# Patient Record
Sex: Female | Born: 1955 | Race: White | Hispanic: No | Smoking: Former smoker
Health system: Southern US, Community
[De-identification: ages and names within clinical notes are randomized; demographics above are authoritative.]

## PROBLEM LIST (undated history)

## (undated) DIAGNOSIS — K219 Gastro-esophageal reflux disease without esophagitis: Secondary | ICD-10-CM

## (undated) DIAGNOSIS — E039 Hypothyroidism, unspecified: Secondary | ICD-10-CM

## (undated) DIAGNOSIS — M94262 Chondromalacia, left knee: Secondary | ICD-10-CM

## (undated) DIAGNOSIS — K8020 Calculus of gallbladder without cholecystitis without obstruction: Secondary | ICD-10-CM

## (undated) DIAGNOSIS — M1990 Unspecified osteoarthritis, unspecified site: Secondary | ICD-10-CM

## (undated) HISTORY — DX: Calculus of gallbladder without cholecystitis without obstruction: K80.20

## (undated) HISTORY — PX: CHOLECYSTECTOMY: SHX55

## (undated) HISTORY — PX: UMBILICAL HERNIA REPAIR: SUR1181

## (undated) HISTORY — PX: OTHER SURGICAL HISTORY: SHX169

## (undated) HISTORY — PX: HERNIA REPAIR: SHX51

## (undated) HISTORY — DX: Hypothyroidism, unspecified: E03.9

## (undated) HISTORY — PX: ORTHOPEDIC SURGERY: SHX850

---

## 2017-07-20 ENCOUNTER — Emergency Department (HOSPITAL_COMMUNITY): Payer: 59

## 2017-07-20 ENCOUNTER — Other Ambulatory Visit: Payer: Self-pay

## 2017-07-20 ENCOUNTER — Encounter (HOSPITAL_COMMUNITY): Payer: Self-pay | Admitting: Emergency Medicine

## 2017-07-20 ENCOUNTER — Emergency Department (HOSPITAL_COMMUNITY)
Admission: EM | Admit: 2017-07-20 | Discharge: 2017-07-20 | Disposition: A | Discharge status: Home / Self Care | Payer: 59 | Attending: Emergency Medicine | Admitting: Emergency Medicine

## 2017-07-20 DIAGNOSIS — R1032 Left lower quadrant pain: Secondary | ICD-10-CM

## 2017-07-20 DIAGNOSIS — Z79899 Other long term (current) drug therapy: Secondary | ICD-10-CM | POA: Diagnosis not present

## 2017-07-20 DIAGNOSIS — K529 Noninfective gastroenteritis and colitis, unspecified: Secondary | ICD-10-CM | POA: Diagnosis not present

## 2017-07-20 LAB — CBC
HCT: 42.3 % (ref 36.0–46.0)
Hemoglobin: 14.2 g/dL (ref 12.0–15.0)
MCH: 29.2 pg (ref 26.0–34.0)
MCHC: 33.6 g/dL (ref 30.0–36.0)
MCV: 87 fL (ref 78.0–100.0)
Platelets: 185 10*3/uL (ref 150–400)
RBC: 4.86 MIL/uL (ref 3.87–5.11)
RDW: 13.7 % (ref 11.5–15.5)
WBC: 6.4 10*3/uL (ref 4.0–10.5)

## 2017-07-20 LAB — COMPREHENSIVE METABOLIC PANEL
ALBUMIN: 4.6 g/dL (ref 3.5–5.0)
ALK PHOS: 72 U/L (ref 38–126)
ALT: 19 U/L (ref 14–54)
ANION GAP: 9 (ref 5–15)
AST: 24 U/L (ref 15–41)
BILIRUBIN TOTAL: 0.5 mg/dL (ref 0.3–1.2)
BUN: 14 mg/dL (ref 6–20)
CALCIUM: 9 mg/dL (ref 8.9–10.3)
CO2: 25 mmol/L (ref 22–32)
CREATININE: 0.98 mg/dL (ref 0.44–1.00)
Chloride: 104 mmol/L (ref 101–111)
GFR calc Af Amer: >60 mL/min (ref >60)
GFR calc non Af Amer: >60 mL/min (ref >60)
GLUCOSE: 119 mg/dL — AB (ref 65–99)
Potassium: 3.5 mmol/L (ref 3.5–5.1)
Sodium: 138 mmol/L (ref 135–145)
TOTAL PROTEIN: 7.4 g/dL (ref 6.5–8.1)

## 2017-07-20 LAB — URINALYSIS, ROUTINE W REFLEX MICROSCOPIC
BILIRUBIN URINE: NEGATIVE
Bacteria, UA: NONE SEEN
GLUCOSE, UA: NEGATIVE mg/dL
Ketones, ur: 20 mg/dL — AB
LEUKOCYTES UA: NEGATIVE
NITRITE: NEGATIVE
PROTEIN: NEGATIVE mg/dL
Specific Gravity, Urine: 1.028 (ref 1.005–1.030)
Squamous Epithelial / LPF: NONE SEEN
pH: 5 (ref 5.0–8.0)

## 2017-07-20 LAB — LIPASE, BLOOD: Lipase: 29 U/L (ref 11–51)

## 2017-07-20 LAB — I-STAT CG4 LACTIC ACID, ED
Lactic Acid, Venous: 2.1 mmol/L (ref 0.5–1.9)
Lactic Acid, Venous: 0.85 mmol/L (ref 0.5–1.9)

## 2017-07-20 MED ORDER — MORPHINE SULFATE (PF) 4 MG/ML IV SOLN
4.0000 mg | Freq: Once | INTRAVENOUS | Status: AC
  Administered 2017-07-20: 4 mg via INTRAVENOUS
  Filled 2017-07-20: qty 1

## 2017-07-20 MED ORDER — LACTATED RINGERS IV BOLUS (SEPSIS)
1000.0000 mL | Freq: Once | INTRAVENOUS | Status: AC
  Administered 2017-07-20: 1000 mL via INTRAVENOUS

## 2017-07-20 NOTE — ED Provider Notes (Signed)
[facility] Provider Note   CSN: [id number] Arrival date & time: 07/20/17  1431     History   Chief Complaint Chief Complaint  Patient presents with  . Abdominal Pain    HPI Victoria Randall is a 61 y.o. female.  The history is provided by the patient.  Abdominal Pain   This is a new problem. The current episode started 3 to 5 hours ago. The problem occurs constantly. The problem has been gradually worsening. The pain is associated with an unknown factor. The pain is located in the LLQ (constant dull pain with coliky sharp pain). The pain is severe. Pertinent negatives include fever, diarrhea, melena, nausea, vomiting, constipation, dysuria, hematuria, headaches and arthralgias. The symptoms are aggravated by palpation and certain positions. Nothing relieves the symptoms.    History reviewed. No pertinent past medical history.  There are no active problems to display for this patient.   History reviewed. No pertinent surgical history.  OB History    No data available       Home Medications    Prior to Admission medications   Medication Sig Start Date End Date Taking? Authorizing Provider  acetaminophen (TYLENOL) 500 MG tablet Take 500 mg by mouth every 6 (six) hours as needed for mild pain.   Yes [provider]  levothyroxine (SYNTHROID, LEVOTHROID) 200 MCG tablet Take 200 mcg by mouth daily before breakfast.   Yes [provider]    Family History No family history on file.  Social History Social History   Tobacco Use  . Smoking status: Not on file  Substance Use Topics  . Alcohol use: Not on file  . Drug use: Not on file     Allergies   Patient has no known allergies.   Review of Systems Review of Systems  Constitutional: Negative for chills and fever.  HENT: Negative for ear pain and sore throat.   Eyes: Negative for pain and visual disturbance.  Respiratory: Negative for cough and shortness of  breath.   Cardiovascular: Negative for chest pain and palpitations.  Gastrointestinal: Positive for abdominal pain. Negative for constipation, diarrhea, melena, nausea and vomiting.  Genitourinary: Negative for difficulty urinating, dysuria, flank pain, hematuria, pelvic pain, vaginal bleeding, vaginal discharge and vaginal pain.  Musculoskeletal: Negative for arthralgias and back pain.  Skin: Negative for color change and rash.  Neurological: Negative for seizures, syncope and headaches.  All other systems reviewed and are negative.    Physical Exam Updated Vital Signs BP 129/74   Pulse 79   Temp 98.8 F (37.1 C) (Oral)   Resp 16   Ht 5\' 4"  (1.626 m)   Wt 77.1 kg (170 lb)   SpO2 95%   BMI 29.18 kg/m   Physical Exam  Constitutional: She appears well-developed and well-nourished. No distress.  HENT:  Head: Normocephalic and atraumatic.  Eyes: Conjunctivae are normal.  Neck: Neck supple.  Cardiovascular: Normal rate and regular rhythm.  No murmur heard. Pulmonary/Chest: Effort normal and breath sounds normal. No respiratory distress.  Abdominal: Soft. There is tenderness in the left lower quadrant. There is guarding. There is no rigidity, no CVA tenderness, no tenderness at McBurney's point and negative Murphy's sign.  Genitourinary: Vagina normal and uterus normal. Pelvic exam was performed with patient supine. Cervix exhibits no motion tenderness, no discharge and no friability. Right adnexum displays no mass, no tenderness and no fullness. Left adnexum displays tenderness. Left adnexum displays no mass and no fullness. No tenderness  or bleeding in the vagina. No vaginal discharge found.  Musculoskeletal: She exhibits no edema.  Neurological: She is alert.  Skin: Skin is warm and dry.  Psychiatric: She has a normal mood and affect.  Nursing note and vitals reviewed.    ED Treatments / Results  Labs (all labs ordered are listed, but only abnormal results are  displayed) Labs Reviewed  COMPREHENSIVE METABOLIC PANEL - Abnormal; Notable for the following components:      Result Value   Glucose, Bld 119 (*)    All other components within normal limits  URINALYSIS, ROUTINE W REFLEX MICROSCOPIC - Abnormal; Notable for the following components:   Hgb urine dipstick MODERATE (*)    Ketones, ur 20 (*)    All other components within normal limits  I-STAT CG4 LACTIC ACID, ED - Abnormal; Notable for the following components:   Lactic Acid, Venous 2.10 (*)    All other components within normal limits  LIPASE, BLOOD  CBC  I-STAT CG4 LACTIC ACID, ED    EKG  EKG Interpretation None       Radiology Ct Renal Stone Study  Result Date: 07/20/2017 CLINICAL DATA:  Patient with left lower quadrant abdominal pain. EXAM: CT ABDOMEN AND PELVIS WITHOUT CONTRAST TECHNIQUE: Multidetector CT imaging of the abdomen and pelvis was performed following the standard protocol without IV contrast. COMPARISON:  None. FINDINGS: Lower chest: Normal heart size. Moderate hiatal hernia. Dependent atelectasis within the bilateral lower lobes. Fat containing right Bochdalek's hernia. Hepatobiliary: Liver is normal in size and contour. Patient status post cholecystectomy. No intrahepatic or extrahepatic biliary ductal dilatation. Pancreas: Unremarkable Spleen: Calcified granulomata in the spleen. Adrenals/Urinary Tract: Normal adrenal glands. Kidneys are symmetric in size. No hydronephrosis. No ureterolithiasis. Urinary bladder is unremarkable. Stomach/Bowel: Descending and sigmoid colonic diverticulosis. No CT evidence for acute diverticulitis. No abnormal bowel wall thickening. Prominent fluid filled loops of small bowel are demonstrated within the left upper quadrant and central abdomen. No free fluid or free intraperitoneal air. Normal appendix. Vascular/Lymphatic: Normal caliber abdominal aorta. No retroperitoneal lymphadenopathy. Reproductive: Uterus and adnexal structures are  unremarkable. Other: Small bilateral fat containing inguinal hernias. Musculoskeletal: Lumbar spine degenerative changes. No aggressive or acute appearing osseous lesions. IMPRESSION: 1. Nonspecific prominent and mildly dilated fluid-filled loops of small bowel within the left hemiabdomen and central abdomen. Findings may represent enteritis or possible early/partial small bowel obstruction. Electronically Signed   By: Annia Belt M.D.   On: 07/20/2017 18:22    Procedures Procedures (including critical care time)  Medications Ordered in ED Medications  morphine 4 MG/ML injection 4 mg (4 mg Intravenous Given 07/20/17 1621)  lactated ringers bolus 1,000 mL (0 mLs Intravenous Stopped 07/20/17 2013)     Initial Impression / Assessment and Plan / ED Course  I have reviewed the triage vital signs and the nursing notes.  Pertinent labs & imaging results that were available during my care of the patient were reviewed by me and considered in my medical decision making (see chart for details).     61 year old female presenting with left lower quadrant abdominal pain that started around noon and was sudden onset.  Dull constant pain that is colicky and intermittently sharp.  She is afebrile and heme dynamically stable.  Exam notable for significant tenderness to the left lower quadrant and left adnexa.  Has a history of a cholecystectomy.  Is postmenopausal.  No evidence of cervical friability or discharge no fevers therefore concern for TOA is low.  No history of  ovarian cyst and due to her age I think that torsion is unlikely but on the differential.  She did have moderate blood in her urine.  Will get CT stone study to rule out nephrolithiasis or bowel obstruction or colitis.   CT scan shows signs of enteritis versus early or partial bowel obstruction.  Patient has not had any constipation or obstipation and is tolerating p.o. and her pain has completely resolved after the morphine.  Lactic acid  resolved after she liter of fluid.  Pain likely related to enteritis or early obstruction but as the patient is tolerating p.o. and having normal bowel movements, will discharge home and have her follow-up with her PCP or general surgeon.   Final Clinical Impressions(s) / ED Diagnoses   Final diagnoses:  Left lower quadrant pain  Enteritis    ED Discharge Orders    None       Owyn Raulston Italyhad, MD 07/20/17 2016    Blane OharaZavitz, Joshua, MD 07/21/17 [phone]

## 2017-07-20 NOTE — ED Triage Notes (Signed)
Per Pt, Pt is coming from home with complaints of LLQ abdominal pain that started today around 1200. Pt reports that she has had a hx of diverticulitis in the past. Denies N/V/D.

## 2017-07-20 NOTE — Discharge Instructions (Signed)
Your CT shows inflammation of the bowel versus an early bowel obstruction. If you have worsening pain, or are unable to have a bowel movement or pass gas, you should return to the Ed. You may also follow up with a general surgeon for possible

## 2017-07-20 NOTE — ED Notes (Signed)
Pelvic cart at bedside. 

## 2017-08-05 ENCOUNTER — Ambulatory Visit: Payer: 59 | Admitting: Internal Medicine

## 2017-08-06 ENCOUNTER — Encounter: Payer: Self-pay | Admitting: Gastroenterology

## 2017-08-22 ENCOUNTER — Encounter: Payer: Self-pay | Admitting: Gastroenterology

## 2017-08-22 ENCOUNTER — Encounter (HOSPITAL_COMMUNITY): Payer: Self-pay

## 2017-08-22 ENCOUNTER — Ambulatory Visit (INDEPENDENT_AMBULATORY_CARE_PROVIDER_SITE_OTHER): Payer: 59 | Admitting: Gastroenterology

## 2017-08-22 ENCOUNTER — Encounter (INDEPENDENT_AMBULATORY_CARE_PROVIDER_SITE_OTHER): Payer: Self-pay

## 2017-08-22 ENCOUNTER — Other Ambulatory Visit: Payer: Self-pay

## 2017-08-22 VITALS — BP 124/92 | HR 88 | Ht 63.75 in | Wt 170.4 lb

## 2017-08-22 DIAGNOSIS — R194 Change in bowel habit: Secondary | ICD-10-CM | POA: Diagnosis not present

## 2017-08-22 DIAGNOSIS — Z1211 Encounter for screening for malignant neoplasm of colon: Secondary | ICD-10-CM | POA: Insufficient documentation

## 2017-08-22 DIAGNOSIS — R1032 Left lower quadrant pain: Secondary | ICD-10-CM | POA: Diagnosis not present

## 2017-08-22 MED ORDER — NA SULFATE-K SULFATE-MG SULF 17.5-3.13-1.6 GM/177ML PO SOLN: 1.0000 | ORAL | 0 refills | Status: DC

## 2017-08-22 NOTE — Patient Instructions (Signed)
You have been scheduled for a colonoscopy. Please follow written instructions given to you at your visit today.  Please pick up your prep supplies at the pharmacy within the next 1-3 days. If you use inhalers (even only as needed), please bring them with you on the [place] of your procedure. Your physician has requested that you go to www.startemmi.com and enter the access code given to you at your visit today. This web site gives a general overview about your procedure. However, you should still follow specific instructions given to you by our office regarding your preparation for the procedure.  Start Miralax daily at night time and Benefiber or Citrucel daily at night time.

## 2017-08-22 NOTE — Progress Notes (Signed)
08/22/2017 Victoria Randall [id number] 10/31/1955   HISTORY OF PRESENT ILLNESS:  This is a pleasant 61 year old female who is new to our practice.  She has been referred here by Dr. Marcille BlancoMatt Tsuei for evaluation regarding left lower quadrant abdominal pain and to discuss colonoscopy.  The patient has never seen GI in the past and has never undergone colonoscopy.  She tells me that over the past month she has had 3 episodes of left lower quadrant abdominal pain.  She says that the pain is very severe and is colicky in nature.  She says that the pain usually only lasts several hours before resolving.  During the first episode she went to the emergency department where a noncontrast CT scan was performed and showed nonspecific prominent mildly dilated fluid-filled loops of small bowel within the left hemiabdomen and central abdomen possibly representing enteritis or partial small bowel obstruction.  She was sent to see Dr. Corliss Skainssuei who suggested diagnoses of diverticulitis versus partial bowel obstructions.  Question related to constipation as she has had a change in bowel habits over the last several months with constipation and very small pellet-like stools.  Says that sometimes several of the pellets are clumped together.  Has tried MiraLAX daily, but does not work for a few days and then once it kicks it tends to cause a lot of diarrhea.  She has to travel far to get to work so does not like to take it when she has to be traveling/working, etc.  Doctor Tsuei actually gave her prescriptions for Cipro and Flagyl in order to have them at home to take in case the pain started again.  On December 22 she once again developed the pain so she began taking those.  It is only a 7 course so she will be completing those tomorrow.  She does not have any pain currently.  She denies any nausea, vomiting, fevers when these episodes occur.  No rectal bleeding.   Past Medical History:  Diagnosis Date  . Gallstones   .  Hypothyroidism    Past Surgical History:  Procedure Laterality Date  . CHOLECYSTECTOMY      reports that she quit smoking about 32 years ago. She quit after 10.00 years of use. she has never used smokeless tobacco. She reports that she drinks alcohol. She reports that she does not use drugs. family history includes Heart disease in her brother; Lung cancer in her father; Other in her mother. No Known Allergies    Outpatient Encounter Medications as of 08/22/2017  Medication Sig  . acetaminophen (TYLENOL) 500 MG tablet Take 500 mg by mouth every 6 (six) hours as needed for mild pain.  . ciprofloxacin (CIPRO) 500 MG tablet Take 500 mg by mouth 2 (two) times daily.  Marland Kitchen. levothyroxine (SYNTHROID, LEVOTHROID) 200 MCG tablet Take 200 mcg by mouth daily before breakfast.  . metroNIDAZOLE (FLAGYL) 500 MG tablet Take 500 mg by mouth 3 (three) times daily.  . polyethylene glycol powder (GLYCOLAX/MIRALAX) powder Take 17 g by mouth as needed.   No facility-administered encounter medications on file as of 08/22/2017.      REVIEW OF SYSTEMS  : All other systems reviewed and negative except where noted in the History of Present Illness.   PHYSICAL EXAM: BP (!) 124/92 (BP Location: Left Arm, Patient Position: Sitting, Cuff Size: Normal)   Pulse 88   Ht 5' 3.75" (1.619 m) Comment: height measured without shoes  Wt 170 lb 6 oz (77.3 kg)  BMI 29.47 kg/m  General: Well developed white female in no acute distress Head: Normocephalic and atraumatic Eyes:  Sclerae anicteric, conjunctiva pink. Ears: Normal auditory acuity Lungs: Clear throughout to auscultation; no increased WOB. Heart: Regular rate and rhythm; no M/R/G. Abdomen: Soft, non-distended.  BS present.  Non-tender. Rectal:  Will be done at the time of colonoscopy. Musculoskeletal: Symmetrical with no gross deformities  Skin: No lesions on visible extremities Extremities: No edema  Neurological: Alert oriented x 4, grossly  non-focal Psychological:  Alert and cooperative. Normal mood and affect  ASSESSMENT AND PLAN: *61 year old female with 3 episodes of severe colicky LLQ abdominal pain that resolves after several hours.  Non-contrast CT scan suggested possible early/partial small bowel obstruction during first episode.  Seems to relieve some when she is able to move her bowels well.  Only surgical history is lap chole.  Does not sound typical of diverticulitis, but she is currently finishing and empiric course of cipro and flagyl. *Change in bowel habits:  A lot of constipation recently with small pellets of stool.  Miralax daily does note kick in for a few days then induces diarrhea.  Will try 1/2 to 1 full dose of Miralax daily and will add in daily powder fiber supplement as well.  **Will schedule for colonoscopy since she has never had one in the past.  Will give a 2 Butrum bowel prep.   CC:  Manus Ruddsuei, Matthew, MD

## 2017-08-22 NOTE — Progress Notes (Signed)
Reviewed and agree with documentation and assessment and plan. K. Veena Nandigam , MD   

## 2017-08-26 HISTORY — PX: SPIGELIAN HERNIA: SHX6100

## 2017-08-27 ENCOUNTER — Encounter (HOSPITAL_COMMUNITY)
Admission: RE | Disposition: A | Discharge status: Home / Self Care | Payer: Self-pay | Source: Ambulatory Visit | Attending: Surgery

## 2017-08-27 ENCOUNTER — Ambulatory Visit (HOSPITAL_COMMUNITY): Payer: Managed Care, Other (non HMO) | Admitting: Registered Nurse

## 2017-08-27 ENCOUNTER — Ambulatory Visit (HOSPITAL_COMMUNITY)
Admission: RE | Admit: 2017-08-27 | Discharge: 2017-08-27 | Disposition: A | Discharge status: Home / Self Care | Payer: Managed Care, Other (non HMO) | Source: Ambulatory Visit | Attending: Surgery | Admitting: Surgery

## 2017-08-27 ENCOUNTER — Other Ambulatory Visit: Payer: Self-pay

## 2017-08-27 ENCOUNTER — Encounter (HOSPITAL_COMMUNITY): Payer: Self-pay | Admitting: *Deleted

## 2017-08-27 DIAGNOSIS — K635 Polyp of colon: Secondary | ICD-10-CM | POA: Diagnosis not present

## 2017-08-27 DIAGNOSIS — K6389 Other specified diseases of intestine: Secondary | ICD-10-CM | POA: Diagnosis not present

## 2017-08-27 DIAGNOSIS — K219 Gastro-esophageal reflux disease without esophagitis: Secondary | ICD-10-CM | POA: Insufficient documentation

## 2017-08-27 DIAGNOSIS — E039 Hypothyroidism, unspecified: Secondary | ICD-10-CM | POA: Insufficient documentation

## 2017-08-27 DIAGNOSIS — Z87891 Personal history of nicotine dependence: Secondary | ICD-10-CM | POA: Insufficient documentation

## 2017-08-27 DIAGNOSIS — K621 Rectal polyp: Secondary | ICD-10-CM | POA: Insufficient documentation

## 2017-08-27 DIAGNOSIS — R1032 Left lower quadrant pain: Secondary | ICD-10-CM | POA: Diagnosis not present

## 2017-08-27 HISTORY — PX: COLONOSCOPY WITH PROPOFOL: SHX5780

## 2017-08-27 HISTORY — DX: Gastro-esophageal reflux disease without esophagitis: K21.9

## 2017-08-27 SURGERY — COLONOSCOPY WITH PROPOFOL
Anesthesia: Monitor Anesthesia Care

## 2017-08-27 MED ORDER — PROMETHAZINE HCL 25 MG/ML IJ SOLN: 6.2500 mg | INTRAMUSCULAR | Status: DC | PRN

## 2017-08-27 MED ORDER — SODIUM CHLORIDE 0.9 % IV SOLN: INTRAVENOUS | Status: DC

## 2017-08-27 MED ORDER — GLYCOPYRROLATE 0.2 MG/ML IJ SOLN
INTRAMUSCULAR | Status: DC | PRN
  Administered 2017-08-27: 0.2 mg via INTRAVENOUS

## 2017-08-27 MED ORDER — PROPOFOL 10 MG/ML IV BOLUS
INTRAVENOUS | Status: AC
  Filled 2017-08-27: qty 40

## 2017-08-27 MED ORDER — ASPIRIN EC 81 MG PO TBEC: 81.0000 mg | DELAYED_RELEASE_TABLET | Freq: Every day | ORAL | Status: DC

## 2017-08-27 MED ORDER — LACTATED RINGERS IV SOLN
INTRAVENOUS | Status: DC
  Administered 2017-08-27: 1000 mL via INTRAVENOUS
  Administered 2017-08-27: 10:00:00 via INTRAVENOUS

## 2017-08-27 MED ORDER — NAPROXEN SODIUM 220 MG PO TABS: 220.0000 mg | ORAL_TABLET | Freq: Every day | ORAL | Status: DC | PRN

## 2017-08-27 MED ORDER — PROPOFOL 10 MG/ML IV BOLUS
INTRAVENOUS | Status: AC
  Filled 2017-08-27: qty 20

## 2017-08-27 MED ORDER — PROPOFOL 500 MG/50ML IV EMUL
INTRAVENOUS | Status: DC | PRN
  Administered 2017-08-27: 200 ug/kg/min via INTRAVENOUS

## 2017-08-27 MED ORDER — MEPERIDINE HCL 25 MG/ML IJ SOLN: 6.2500 mg | INTRAMUSCULAR | Status: DC | PRN

## 2017-08-27 MED ORDER — LIDOCAINE HCL (CARDIAC) 20 MG/ML IV SOLN
INTRAVENOUS | Status: DC | PRN
  Administered 2017-08-27: 50 mg via INTRAVENOUS

## 2017-08-27 MED ORDER — MIDAZOLAM HCL 5 MG/ML IJ SOLN: 0.5000 mg | Freq: Once | INTRAMUSCULAR | Status: DC | PRN

## 2017-08-27 NOTE — H&P (Signed)
CC: Prior episodes of LLQ pain  HPI: Victoria Randall is an 62 y.o. female who is here for screening colonoscopy  She is in reasonably good health and recently moved to the area from [place], [place].  She has not yet established a PCP and has never had a colonoscopy.  She takes medication for hypothyroidism, prescribed by her GYN and her only surgery was a lap chole in 2008.  Over the last two years, she had two moderate episodes of LLQ pain.  Each of these episodes lasted only a few hours.  She was not treated with abx.  Over the last year, she has noticed increasing constipation and now she only passes "rabbit pellets" unless she is taking Mg Citrate and fiber.  On 07/20/17, she presented to the ED with severe LLQ abdominal pain, but no nausea, vomiting, or diarrhea.  Her symptoms improved and she was discharged.  She had a similar episode a few days later that resolved after a few hours.  She had a CT renal colic 07/20/17 which showed nonspecific prominent and mildly dilated fluid-filled loops of small bowel within the left hemiabdomen and central abdomen. Findings may represent enteritis or possible early/partial small bowel obstruction.  She is a patient of my partner, Dr. Corliss Skains and he has sent her to me for a colonoscopy  She has never had CT evidence of diverticulitis  Last 'pain' episode was 12/22; each of these last a couple hours and are alleviated by laxatives.  Fhx: Denies fhx of colorectal cancer  Past Medical History:  Diagnosis Date  . Gallstones   . GERD (gastroesophageal reflux disease)    hx of  . Hypothyroidism     Past Surgical History:  Procedure Laterality Date  . CHOLECYSTECTOMY      Family History  Problem Relation Age of Onset  . Other Mother        squamous cell tumor  . Lung cancer Father   . Heart disease Brother     Social:  reports that she quit smoking about 32 years ago. She quit after 10.00 years of use. she has never used smokeless tobacco. She  reports that she drinks alcohol. She reports that she does not use drugs.  Allergies: No Known Allergies  Medications: I have reviewed the patient's current medications.  No results found for this or any previous visit (from the past 48 hour(s)).  No results found.  ROS - all of the below systems have been reviewed with the patient and positives are indicated with bold text General: chills, fever or night sweats Eyes: blurry vision or double vision ENT: epistaxis or sore throat Allergy/Immunology: itchy/watery eyes or nasal congestion Hematologic/Lymphatic: bleeding problems, blood clots or swollen lymph nodes Endocrine: temperature intolerance or unexpected weight changes Breast: new or changing breast lumps or nipple discharge Resp: cough, shortness of breath, or wheezing CV: chest pain or dyspnea on exertion GI: as per HPI GU: dysuria, trouble voiding, or hematuria MSK: joint pain or joint stiffness Neuro: TIA or stroke symptoms Derm: pruritus and skin lesion changes Psych: anxiety and depression  PE Blood pressure (!) 130/98, pulse 94, temperature 98.9 F (37.2 C), temperature source Oral, height 5\' 4"  (1.626 m), weight 77.1 kg (170 lb), SpO2 96 %. Constitutional: NAD; conversant; no deformities Eyes: Moist conjunctiva; no lid lag; anicteric; PERRL Neck: Trachea midline; no thyromegaly Lungs: Normal respiratory effort; no tactile fremitus CV: RRR; no palpable thrills; no pitting edema GI: Abd soft, NT/ND; no palpable hepatosplenomegaly MSK:  Normal gait; no clubbing/cyanosis Psychiatric: Appropriate affect; alert and oriented x3 Lymphatic: No palpable cervical or axillary lymphadenopathy   A/P: Victoria Randall is an 62 y.o. female with hx of LLQ pain, no prior colonoscopy -Colonoscopy today -The planned procedure, material risks (including, but not limited to, pain, bleeding, infection, perforation of colon, need for additional procedures, damage to surrounding structures,  heart attack, stroke, death)  Stephanie Couphristopher M. Cliffton AstersWhite, M.D. General and Colorectal Surgery [facility]

## 2017-08-27 NOTE — Op Note (Signed)
[facility] Patient Name: Victoria Randall Procedure Date: 08/27/2017 MRN: [id number] Attending MD: Ileana Roup MD, MD Date of Birth: August 07, 1956 CSN: [id number] Age: 62 Admit Type: Outpatient Procedure:                Colonoscopy Indications:              Abdominal pain in the left lower quadrant Providers:                Sharon Mt. Dallen Bunte MD, MD, Elmer Ramp. Tilden Dome, RN,                            William Dalton, Technician Referring MD:              Medicines:                Monitored Anesthesia Care Complications:            No immediate complications. Estimated Blood Loss:     Estimated blood loss was minimal. Procedure:                Pre-Anesthesia Assessment:                           - Prior to the procedure, a History and Physical                            was performed, and patient medications, allergies                            and sensitivities were reviewed. The patient's                            tolerance of previous anesthesia was reviewed.                           - The risks and benefits of the procedure and the                            sedation options and risks were discussed with the                            patient. All questions were answered and informed                            consent was obtained.                           - Patient identification and proposed procedure                            were verified prior to the procedure by the                            physician, the nurse and the anesthetist. The  procedure was verified in the pre-procedure area in                            the endoscopy suite.                           - ASA Grade Assessment: II - A patient with mild                            systemic disease.                           - The anesthesia plan was to use monitored                            anesthesia care (MAC).                           After obtaining informed consent,  the colonoscope                            was passed under direct vision. Throughout the                            procedure, the patient's blood pressure, pulse, and                            oxygen saturations were monitored continuously. The                            Colonoscope was introduced through the anus and                            advanced to the the terminal ileum, with                            identification of the appendiceal orifice and IC                            valve. The colonoscopy was performed without                            difficulty. The patient tolerated the procedure                            well. The quality of the bowel preparation was                            adequate. Scope In: 8:28:51 AM Scope Out: 9:26:02 AM Scope Withdrawal Time: 0 hours 26 minutes 16 seconds  Total Procedure Duration: 0 hours 57 minutes 11 seconds  Findings:      The perianal and digital rectal examinations were normal. Pertinent       negatives include normal sphincter tone.      A 3 mm polyp was found in the sigmoid  colon. The polyp was semi-sessile.       The polyp was removed with a cold biopsy forceps. Resection and       retrieval were complete. Estimated blood loss was minimal.      Two semi-sessile polyps were found in the rectum. The polyps were 2 to 3       mm in size. These polyps were removed with a cold biopsy forceps.       Resection and retrieval were complete. Estimated blood loss was minimal.      A localized area of moderately erythematous mucosa was found in the       transverse colon.      Biopsies were taken with a cold forceps in the rectum, in the descending       colon, in the transverse colon and in the ascending colon for histology.       Estimated blood loss was minimal.      Rectum appeared very mildly inflammed ?prep; biopsies taken      The terminal ileum appeared normal. Biopsies were taken with a cold       forceps for histology.  Estimated blood loss was minimal.      Multiple small and large-mouthed diverticula were found in the entire       colon.      The exam was otherwise without abnormality on direct and retroflexion       views. Impression:               - One 3 mm polyp in the sigmoid colon, removed with                            a cold biopsy forceps. Resected and retrieved.                           - Two 2 to 3 mm polyps in the rectum, removed with                            a cold biopsy forceps. Resected and retrieved.                           - Erythematous mucosa in the transverse colon.                           - The examined portion of the ileum was normal.                            Biopsied.                           - Rectum mildly inflammed; ?prep - biopsies taken                           - The examination was otherwise normal on direct                            and retroflexion views.                           -  Biopsies were taken with a cold forceps for                            histology in the rectum, in the descending colon,                            in the transverse colon and in the ascending colon. Moderate Sedation:      N/A- Per Anesthesia Care Recommendation:           - Discharge patient to home.                           - High fiber diet indefinitely.                           - No aspirin, ibuprofen, naproxen, or other                            non-steroidal anti-inflammatory drugs for 7 days                            after polyp removal.                           - Await pathology results.                           - Repeat colonoscopy in 5 years for surveillance.                           - Return to referring physician at appointment to                            be scheduled. Procedure Code(s):        --- Professional ---                           [phone], Colonoscopy, flexible; with biopsy, single                            or multiple Diagnosis Code(s):         --- Professional ---                           R10.32, Left lower quadrant pain CPT copyright 2016 American Medical Association. All rights reserved. The codes documented in this report are preliminary and upon coder review may  be revised to meet current compliance requirements. Nadeen Landau, MD Ileana Roup MD, MD 08/27/2017 9:39:29 AM This report has been signed electronically. Number of Addenda: 0

## 2017-08-27 NOTE — Transfer of Care (Signed)
Immediate Anesthesia Transfer of Care Note  Patient: Victoria Randall  Procedure(s) Performed: COLONOSCOPY WITH PROPOFOL (N/A )  Patient Location: PACU  Anesthesia Type:MAC  Level of Consciousness: alert , oriented, drowsy and responds to stimulation  Airway & Oxygen Therapy: Patient Spontanous Breathing and Patient connected to face mask oxygen  Post-op Assessment: Report given to RN, Post -op Vital signs reviewed and stable and Patient moving all extremities X 4  Post vital signs: stable  Last Vitals:  Vitals:   08/27/17 0724 08/27/17 0933  BP: (!) 130/98 (!) 88/63  Pulse: 94 96  Resp:  20  Temp: 37.2 C   SpO2: 96% 100%    Last Pain:  Vitals:   08/27/17 0933  TempSrc: Oral         Complications: No apparent anesthesia complications

## 2017-08-27 NOTE — Anesthesia Postprocedure Evaluation (Signed)
Anesthesia Post Note  Patient: Jennavie Martinek [place]  Procedure(s) Performed: COLONOSCOPY WITH PROPOFOL (N/A )     Patient location during evaluation: Endoscopy Anesthesia Type: MAC Level of consciousness: awake and alert, patient cooperative and oriented Pain management: pain level controlled Vital Signs Assessment: post-procedure vital signs reviewed and stable Respiratory status: spontaneous breathing, nonlabored ventilation and respiratory function stable Cardiovascular status: stable and blood pressure returned to baseline Postop Assessment: no apparent nausea or vomiting Anesthetic complications: no    Last Vitals:  Vitals:   08/27/17 1000 08/27/17 1010  BP: 102/85 (!) 150/82  Pulse: 96 89  Resp: 15 15  Temp:    SpO2: 100% 95%    Last Pain:  Vitals:   08/27/17 0933  TempSrc: Oral                 Tinya Cadogan,E. Holten Spano

## 2017-08-27 NOTE — Discharge Instructions (Signed)

## 2017-08-27 NOTE — Anesthesia Procedure Notes (Signed)
Procedure Name: MAC Date/Time: 08/27/2017 8:24 AM Performed by: Lissa Morales, CRNA Pre-anesthesia Checklist: Patient identified, Emergency Drugs available, Suction available, Patient being monitored and Timeout performed Patient Re-evaluated:Patient Re-evaluated prior to induction Oxygen Delivery Method: Simple face mask Placement Confirmation: positive ETCO2 Dental Injury: Teeth and Oropharynx as per pre-operative assessment

## 2017-08-27 NOTE — Anesthesia Preprocedure Evaluation (Addendum)
Anesthesia Evaluation  Patient identified by MRN, date of birth, ID band Patient awake    Reviewed: Allergy & Precautions, NPO status , Patient's Chart, lab work & pertinent test results  History of Anesthesia Complications Negative for: history of anesthetic complications  Airway Mallampati: II  TM Distance: >3 FB Neck ROM: Full    Dental  (+) Dental Advisory Given   Pulmonary former smoker (quit 1986),    breath sounds clear to auscultation       Cardiovascular negative cardio ROS   Rhythm:Regular Rate:Normal     Neuro/Psych negative neurological ROS     GI/Hepatic Neg liver ROS, GERD  Controlled,  Endo/Other  Hypothyroidism   Renal/GU negative Renal ROS     Musculoskeletal   Abdominal   Peds  Hematology negative hematology ROS (+)   Anesthesia Other Findings   Reproductive/Obstetrics                            Anesthesia Physical Anesthesia Plan  ASA: II  Anesthesia Plan: MAC   Post-op Pain Management:    Induction:   PONV Risk Score and Plan: 2 and Treatment may vary due to age or medical condition  Airway Management Planned: Natural Airway and Nasal Cannula  Additional Equipment:   Intra-op Plan:   Post-operative Plan:   Informed Consent: I have reviewed the patients History and Physical, chart, labs and discussed the procedure including the risks, benefits and alternatives for the proposed anesthesia with the patient or authorized representative who has indicated his/her understanding and acceptance.   Dental advisory given  Plan Discussed with: CRNA and Surgeon  Anesthesia Plan Comments: (Plan routine monitors, MAC)        Anesthesia Quick Evaluation

## 2017-08-28 ENCOUNTER — Ambulatory Visit: Payer: Self-pay | Admitting: Surgery

## 2017-08-28 ENCOUNTER — Observation Stay (HOSPITAL_COMMUNITY)
Admission: EM | Admit: 2017-08-28 | Discharge: 2017-08-30 | Disposition: A | Discharge status: Home / Self Care | Payer: Managed Care, Other (non HMO) | Attending: Surgery | Admitting: Surgery

## 2017-08-28 ENCOUNTER — Other Ambulatory Visit: Payer: Self-pay

## 2017-08-28 ENCOUNTER — Encounter (HOSPITAL_COMMUNITY): Payer: Self-pay

## 2017-08-28 ENCOUNTER — Ambulatory Visit: Payer: Managed Care, Other (non HMO) | Admitting: Gastroenterology

## 2017-08-28 ENCOUNTER — Ambulatory Visit (INDEPENDENT_AMBULATORY_CARE_PROVIDER_SITE_OTHER)
Admission: RE | Admit: 2017-08-28 | Discharge: 2017-08-28 | Disposition: A | Discharge status: Home / Self Care | Payer: Managed Care, Other (non HMO) | Source: Ambulatory Visit | Attending: Gastroenterology | Admitting: Gastroenterology

## 2017-08-28 ENCOUNTER — Telehealth: Payer: Self-pay | Admitting: Gastroenterology

## 2017-08-28 DIAGNOSIS — Z86010 Personal history of colonic polyps: Secondary | ICD-10-CM | POA: Insufficient documentation

## 2017-08-28 DIAGNOSIS — K429 Umbilical hernia without obstruction or gangrene: Secondary | ICD-10-CM | POA: Diagnosis not present

## 2017-08-28 DIAGNOSIS — R1032 Left lower quadrant pain: Secondary | ICD-10-CM | POA: Diagnosis not present

## 2017-08-28 DIAGNOSIS — Z87891 Personal history of nicotine dependence: Secondary | ICD-10-CM | POA: Insufficient documentation

## 2017-08-28 DIAGNOSIS — Z79899 Other long term (current) drug therapy: Secondary | ICD-10-CM | POA: Insufficient documentation

## 2017-08-28 DIAGNOSIS — E039 Hypothyroidism, unspecified: Secondary | ICD-10-CM | POA: Diagnosis not present

## 2017-08-28 DIAGNOSIS — K439 Ventral hernia without obstruction or gangrene: Secondary | ICD-10-CM | POA: Diagnosis present

## 2017-08-28 DIAGNOSIS — Z7982 Long term (current) use of aspirin: Secondary | ICD-10-CM | POA: Insufficient documentation

## 2017-08-28 DIAGNOSIS — Z8249 Family history of ischemic heart disease and other diseases of the circulatory system: Secondary | ICD-10-CM | POA: Insufficient documentation

## 2017-08-28 DIAGNOSIS — K219 Gastro-esophageal reflux disease without esophagitis: Secondary | ICD-10-CM | POA: Diagnosis not present

## 2017-08-28 DIAGNOSIS — K436 Other and unspecified ventral hernia with obstruction, without gangrene: Secondary | ICD-10-CM | POA: Diagnosis not present

## 2017-08-28 LAB — CBC WITH DIFFERENTIAL/PLATELET
Basophils Absolute: 0 10*3/uL (ref 0.0–0.1)
Basophils Relative: 0 %
Eosinophils Absolute: 0 10*3/uL (ref 0.0–0.7)
Eosinophils Relative: 0 %
HCT: 39.1 % (ref 36.0–46.0)
HEMOGLOBIN: 13.1 g/dL (ref 12.0–15.0)
LYMPHS ABS: 1.7 10*3/uL (ref 0.7–4.0)
LYMPHS PCT: 22 %
MCH: 28.8 pg (ref 26.0–34.0)
MCHC: 33.5 g/dL (ref 30.0–36.0)
MCV: 85.9 fL (ref 78.0–100.0)
Monocytes Absolute: 0.4 10*3/uL (ref 0.1–1.0)
Monocytes Relative: 6 %
NEUTROS PCT: 72 %
Neutro Abs: 5.7 10*3/uL (ref 1.7–7.7)
Platelets: 217 10*3/uL (ref 150–400)
RBC: 4.55 MIL/uL (ref 3.87–5.11)
RDW: 12.6 % (ref 11.5–15.5)
WBC: 7.8 10*3/uL (ref 4.0–10.5)

## 2017-08-28 LAB — COMPREHENSIVE METABOLIC PANEL
ALBUMIN: 3.9 g/dL (ref 3.5–5.0)
ALT: 20 U/L (ref 14–54)
ANION GAP: 10 (ref 5–15)
AST: 38 U/L (ref 15–41)
Alkaline Phosphatase: 53 U/L (ref 38–126)
BUN: 12 mg/dL (ref 6–20)
CHLORIDE: 100 mmol/L — AB (ref 101–111)
CO2: 23 mmol/L (ref 22–32)
Calcium: 8.5 mg/dL — ABNORMAL LOW (ref 8.9–10.3)
Creatinine, Ser: 0.82 mg/dL (ref 0.44–1.00)
GFR calc Af Amer: >60 mL/min (ref >60)
GFR calc non Af Amer: >60 mL/min (ref >60)
GLUCOSE: 110 mg/dL — AB (ref 65–99)
Potassium: 4.3 mmol/L (ref 3.5–5.1)
SODIUM: 133 mmol/L — AB (ref 135–145)
Total Bilirubin: 1.1 mg/dL (ref 0.3–1.2)
Total Protein: 7.1 g/dL (ref 6.5–8.1)

## 2017-08-28 LAB — URINALYSIS, ROUTINE W REFLEX MICROSCOPIC
BACTERIA UA: NONE SEEN
Bilirubin Urine: NEGATIVE
Glucose, UA: NEGATIVE mg/dL
KETONES UR: NEGATIVE mg/dL
LEUKOCYTES UA: NEGATIVE
Nitrite: NEGATIVE
PROTEIN: NEGATIVE mg/dL
Specific Gravity, Urine: 1.041 — ABNORMAL HIGH (ref 1.005–1.030)
pH: 8 (ref 5.0–8.0)

## 2017-08-28 LAB — CBC
HCT: 38.5 % (ref 36.0–46.0)
HEMOGLOBIN: 13 g/dL (ref 12.0–15.0)
MCH: 29.1 pg (ref 26.0–34.0)
MCHC: 33.8 g/dL (ref 30.0–36.0)
MCV: 86.3 fL (ref 78.0–100.0)
Platelets: 199 10*3/uL (ref 150–400)
RBC: 4.46 MIL/uL (ref 3.87–5.11)
RDW: 12.8 % (ref 11.5–15.5)
WBC: 7.4 10*3/uL (ref 4.0–10.5)

## 2017-08-28 LAB — CREATININE, SERUM
Creatinine, Ser: 0.75 mg/dL (ref 0.44–1.00)
GFR calc Af Amer: >60 mL/min (ref >60)

## 2017-08-28 LAB — LIPASE, BLOOD: Lipase: 22 U/L (ref 11–51)

## 2017-08-28 MED ORDER — DIPHENHYDRAMINE HCL 12.5 MG/5ML PO ELIX: 12.5000 mg | ORAL_SOLUTION | Freq: Four times a day (QID) | ORAL | Status: DC | PRN

## 2017-08-28 MED ORDER — ONDANSETRON 4 MG PO TBDP: 4.0000 mg | ORAL_TABLET | Freq: Four times a day (QID) | ORAL | Status: DC | PRN

## 2017-08-28 MED ORDER — DIPHENHYDRAMINE HCL 50 MG/ML IJ SOLN: 12.5000 mg | Freq: Four times a day (QID) | INTRAMUSCULAR | Status: DC | PRN

## 2017-08-28 MED ORDER — MORPHINE SULFATE (PF) 4 MG/ML IV SOLN
4.0000 mg | Freq: Once | INTRAVENOUS | Status: AC
  Administered 2017-08-28: 4 mg via INTRAVENOUS
  Filled 2017-08-28: qty 1

## 2017-08-28 MED ORDER — ACETAMINOPHEN 325 MG PO TABS: 650.0000 mg | ORAL_TABLET | Freq: Four times a day (QID) | ORAL | Status: DC | PRN

## 2017-08-28 MED ORDER — ONDANSETRON HCL 4 MG/2ML IJ SOLN
4.0000 mg | Freq: Four times a day (QID) | INTRAMUSCULAR | Status: DC | PRN
  Administered 2017-08-29: 4 mg via INTRAVENOUS
  Filled 2017-08-28: qty 2

## 2017-08-28 MED ORDER — ONDANSETRON HCL 4 MG/2ML IJ SOLN
4.0000 mg | Freq: Once | INTRAMUSCULAR | Status: AC | PRN
  Administered 2017-08-28: 4 mg via INTRAVENOUS
  Filled 2017-08-28: qty 2

## 2017-08-28 MED ORDER — CEFAZOLIN SODIUM-DEXTROSE 2-4 GM/100ML-% IV SOLN
2.0000 g | INTRAVENOUS | Status: AC
  Administered 2017-08-29: 2 g via INTRAVENOUS
  Filled 2017-08-28: qty 100

## 2017-08-28 MED ORDER — MORPHINE SULFATE (PF) 2 MG/ML IV SOLN: 2.0000 mg | INTRAVENOUS | Status: DC | PRN

## 2017-08-28 MED ORDER — IOPAMIDOL (ISOVUE-300) INJECTION 61%: 100.0000 mL | Freq: Once | INTRAVENOUS | Status: DC | PRN

## 2017-08-28 MED ORDER — HEPARIN SODIUM (PORCINE) 5000 UNIT/ML IJ SOLN: 5000.0000 [IU] | Freq: Three times a day (TID) | INTRAMUSCULAR | Status: DC

## 2017-08-28 MED ORDER — SODIUM CHLORIDE 0.9 % IV BOLUS (SEPSIS)
1000.0000 mL | Freq: Once | INTRAVENOUS | Status: AC
  Administered 2017-08-28: 1000 mL via INTRAVENOUS

## 2017-08-28 MED ORDER — OXYCODONE-ACETAMINOPHEN 5-325 MG PO TABS: 1.0000 | ORAL_TABLET | ORAL | Status: DC | PRN

## 2017-08-28 MED ORDER — SODIUM CHLORIDE 0.9 % IV SOLN
INTRAVENOUS | Status: DC
  Administered 2017-08-28 – 2017-08-29 (×2): via INTRAVENOUS

## 2017-08-28 NOTE — H&P (Deleted)
NOTE: The H&P entered today at 5:04pm was entered in error for this patient. Working with HIM to attempt to delete this H&P.  Stephanie Couphristopher M. Cliffton AstersWhite, M.D. General and Colorectal Surgery [facility]

## 2017-08-28 NOTE — ED Notes (Signed)
ED TO INPATIENT HANDOFF REPORT  Name/Age/Gender Victoria Randall 62 y.o. female  Code Status    Code Status Orders  (From admission, onward)        Start     Ordered   08/28/17 1722  Full code  Continuous     08/28/17 1729    Code Status History    Date Active Date Inactive Code Status Order ID Comments User Context   This patient has a current code status but no historical code status.    Advance Directive Documentation     Most Recent Value  Type of Advance Directive  Healthcare Power of Attorney, Living will  Pre-existing out of facility DNR order (yellow form or pink MOST form)  No data  "MOST" Form in Place?  No data      Home/SNF/Other Home  Chief Complaint hernia   Level of Care/Admitting Diagnosis ED Disposition    ED Disposition Condition Comment   Admit  Hospital Area: [facility] [place] [[id number]]  Level of Care: Med-Surg [16]  Diagnosis: Spigelian hernia [671245]  Admitting Physician: CCS, [place]  Attending Physician: CCS, MD [[id number]]  PT Class (Do Not Modify): Observation [104]  PT Acc Code (Do Not Modify): Observation [10022]       Medical History Past Medical History:  Diagnosis Date  . Gallstones   . GERD (gastroesophageal reflux disease)    hx of  . Hypothyroidism     Allergies No Known Allergies  IV Location/Drains/Wounds Patient Lines/Drains/Airways Status   Active Line/Drains/Airways    Name:   Placement date:   Placement time:   Site:   Days:   Peripheral IV 08/28/17 Left Antecubital   08/28/17    1510    Antecubital   less than 1   AIRWAYS   08/27/17    0819     1   AIRWAYS   08/27/17    0819     1          Labs/Imaging Results for orders placed or performed during the hospital encounter of 08/28/17 (from the past 48 hour(s))  Comprehensive metabolic panel     Status: Abnormal   Collection Time: 08/28/17  4:29 PM  Result Value Ref Range   Sodium 133 (L) 135 - 145 mmol/L   Potassium 4.3 3.5 - 5.1 mmol/L   Chloride 100 (L) 101 - 111 mmol/L   CO2 23 22 - 32 mmol/L   Glucose, Bld 110 (H) 65 - 99 mg/dL   BUN 12 6 - 20 mg/dL   Creatinine, Ser 0.82 0.44 - 1.00 mg/dL   Calcium 8.5 (L) 8.9 - 10.3 mg/dL   Total Protein 7.1 6.5 - 8.1 g/dL   Albumin 3.9 3.5 - 5.0 g/dL   AST 38 15 - 41 U/L   ALT 20 14 - 54 U/L   Alkaline Phosphatase 53 38 - 126 U/L   Total Bilirubin 1.1 0.3 - 1.2 mg/dL   GFR calc non Af Amer >60 >60 mL/min   GFR calc Af Amer >60 >60 mL/min    Comment: (NOTE) The eGFR has been calculated using the CKD EPI equation. This calculation has not been validated in all clinical situations. eGFR's persistently <60 mL/min signify possible Chronic Kidney Disease.    Anion gap 10 5 - 15  CBC with Differential     Status: None   Collection Time: 08/28/17  4:29 PM  Result Value Ref Range   WBC 7.8 4.0 - 10.5 K/uL  RBC 4.55 3.87 - 5.11 MIL/uL   Hemoglobin 13.1 12.0 - 15.0 g/dL   HCT 39.1 36.0 - 46.0 %   MCV 85.9 78.0 - 100.0 fL   MCH 28.8 26.0 - 34.0 pg   MCHC 33.5 30.0 - 36.0 g/dL   RDW 12.6 11.5 - 15.5 %   Platelets 217 150 - 400 K/uL   Neutrophils Relative % 72 %   Neutro Abs 5.7 1.7 - 7.7 K/uL   Lymphocytes Relative 22 %   Lymphs Abs 1.7 0.7 - 4.0 K/uL   Monocytes Relative 6 %   Monocytes Absolute 0.4 0.1 - 1.0 K/uL   Eosinophils Relative 0 %   Eosinophils Absolute 0.0 0.0 - 0.7 K/uL   Basophils Relative 0 %   Basophils Absolute 0.0 0.0 - 0.1 K/uL  Lipase, blood     Status: None   Collection Time: 08/28/17  4:29 PM  Result Value Ref Range   Lipase 22 11 - 51 U/L   Ct Abdomen Pelvis W Contrast  Result Date: 08/28/2017 CLINICAL DATA:  Left lower quadrant pain since November 25th. Normal colonoscopy yesterday. Evaluate for diverticulitis. EXAM: CT ABDOMEN AND PELVIS WITH CONTRAST TECHNIQUE: Multidetector CT imaging of the abdomen and pelvis was performed using the standard protocol following bolus administration of intravenous contrast. CONTRAST:  100 cc of Isovue-300  COMPARISON:  None. FINDINGS: Lower chest: Tiny fat containing right sided Bochdalek's hernia. Normal heart size without pericardial or pleural effusion. Hepatobiliary: Normal liver. Cholecystectomy, without biliary ductal dilatation. Pancreas: Normal, without mass or ductal dilatation. Spleen: Old granulomatous disease in the spleen. Adrenals/Urinary Tract: Normal adrenal glands. Normal kidneys, without hydronephrosis. Normal urinary bladder. Stomach/Bowel: Normal stomach, without wall thickening. Scattered colonic diverticula. Normal terminal ileum and appendix. Proximal small bowel loops are contrast filled and mildly dilated. Dilated bowel continues to the level of a left-sided Spigelian hernia, causing partial small bowel obstruction including on image 58/series 2. No findings to suggest complicating ischemia. Decompressed distal small bowel. Vascular/Lymphatic: Normal caliber of the aorta and branch vessels. No abdominopelvic adenopathy. Reproductive: Normal uterus and adnexa. Other: No significant free fluid. Mild pelvic floor laxity. Fat containing ventral/ periumbilical hernia is small. Musculoskeletal: No acute osseous abnormality. IMPRESSION: Left-sided Spigelian hernia, containing partially obstructive mid small bowel. No complicating ischemia. These results will be called to the ordering clinician or representative by the Radiologist Assistant, and communication documented in the PACS or zVision Dashboard. Electronically Signed   By: Abigail Miyamoto M.D.   On: 08/28/2017 15:32    Pending Labs Unresulted Labs (From admission, onward)   Start     Ordered   08/28/17 1722  HIV antibody (Routine Testing)  Once,   R     08/28/17 1729   08/28/17 1722  CBC  (heparin)  Once,   R    Comments:  Baseline for heparin therapy IF NOT ALREADY DRAWN.  Notify MD if PLT < 100 K.    08/28/17 1729   08/28/17 1722  Creatinine, serum  (heparin)  Once,   R    Comments:  Baseline for heparin therapy IF NOT ALREADY  DRAWN.    08/28/17 1729   08/28/17 1629  Urinalysis, Routine w reflex microscopic  STAT,   STAT     08/28/17 1629      Vitals/Pain Today's Vitals   08/28/17 1624 08/28/17 1627  BP: (!) 153/90   Pulse: 75   Resp: 16   Temp: 98.7 F (37.1 C)   TempSrc: Oral   SpO2:  99%   PainSc:  10-Worst pain ever    Isolation Precautions No active isolations  Medications Medications  heparin injection 5,000 Units (not administered)  0.9 %  sodium chloride infusion (not administered)  morphine 2 MG/ML injection 2-3 mg (not administered)  oxyCODONE-acetaminophen (PERCOCET/ROXICET) 5-325 MG per tablet 1-2 tablet (not administered)  diphenhydrAMINE (BENADRYL) 12.5 MG/5ML elixir 12.5 mg (not administered)    Or  diphenhydrAMINE (BENADRYL) injection 12.5 mg (not administered)  ondansetron (ZOFRAN-ODT) disintegrating tablet 4 mg (not administered)    Or  ondansetron (ZOFRAN) injection 4 mg (not administered)  ceFAZolin (ANCEF) IVPB 2g/100 mL premix (not administered)  acetaminophen (TYLENOL) tablet 650 mg (not administered)  sodium chloride 0.9 % bolus 1,000 mL (1,000 mLs Intravenous New Bag/Given 08/28/17 1638)  morphine 4 MG/ML injection 4 mg (4 mg Intravenous Given 08/28/17 1726)  ondansetron (ZOFRAN) injection 4 mg (4 mg Intravenous Given 08/28/17 1726)    Mobility walks

## 2017-08-28 NOTE — Telephone Encounter (Signed)
Discussed with Doug SouJessica Zehr, PA Patient needs CT today or ED.

## 2017-08-28 NOTE — ED Notes (Signed)
Bed: WA21 Expected date:  Expected time:  Means of arrival:  Comments: Angelique Blonderenise Sloma

## 2017-08-28 NOTE — ED Triage Notes (Signed)
Pt had a colonoscopy with polyps removed yesterday. She had severe pain last night that responded to pain medication and then another pain attack this morning. She had a CT at 3p that showed an incarcerated hernia. Pain is in her LLQ. No nausea, no fever, and no vomiting. Pain since 10a.

## 2017-08-28 NOTE — ED Provider Notes (Signed)
[facility] DEPT Provider Note   CSN: [id number] Arrival date & time: 08/28/17  1615     History   Chief Complaint Chief Complaint  Patient presents with  . Abdominal Pain  . Hernia    HPI Victoria Randall is a 62 y.o. female with a history of hypothyroid who presents today for evaluation of left lower quadrant pain.  She reports that her pain began in November and is intermittent in nature.  She had a colonoscopy yesterday to evaluate this pain with polyp removal after having a normal CT in November.  This morning she began having pain at around 10 AM and called her GI doctor who arranged for a outpatient CT scan with contrast.  This was obtained showing an incarcerated spigelian hernia.  She was directed to the emergency room.  She reports constant pain, denies any nausea.  At the time of my evaluation GI had already consulted surgery who was in the room evaluating the patient.  She denies any other symptoms of concern today.   HPI  Past Medical History:  Diagnosis Date  . Gallstones   . GERD (gastroesophageal reflux disease)    hx of  . Hypothyroidism     Patient Active Problem List   Diagnosis Date Noted  . LLQ abdominal pain 08/22/2017  . Special screening for malignant neoplasms, colon 08/22/2017  . Change in bowel habits 08/22/2017    Past Surgical History:  Procedure Laterality Date  . CHOLECYSTECTOMY      OB History    No data available       Home Medications    Prior to Admission medications   Medication Sig Start Date End Date Taking? Authorizing Provider  acetaminophen (TYLENOL) 500 MG tablet Take 1,000 mg by mouth daily as needed for mild pain.     [provider]  aspirin EC 81 MG tablet Take 1 tablet (81 mg total) by mouth daily. 09/03/17   Ileana Roup, MD  levothyroxine (SYNTHROID, LEVOTHROID) 200 MCG tablet Take 200 mcg by mouth daily before breakfast.    [provider]  Na Sulfate-K Sulfate-Mg  Sulf (SUPREP BOWEL PREP KIT) 17.5-3.13-1.6 GM/177ML SOLN Take 1 kit by mouth as directed. 08/22/17   Zehr, Janett Billow D, PA-C  naproxen sodium (ALEVE) 220 MG tablet Take 1 tablet (220 mg total) by mouth daily as needed (pain). 09/03/17   Ileana Roup, MD  polyethylene glycol powder (GLYCOLAX/MIRALAX) powder Take 17 g by mouth daily.     [provider]    Family History Family History  Problem Relation Age of Onset  . Other Mother        squamous cell tumor  . Lung cancer Father   . Heart disease Brother     Social History Social History   Tobacco Use  . Smoking status: Former Smoker    Years: 10.00    Last attempt to quit: 08/22/1985    Years since quitting: 32.0  . Smokeless tobacco: Never Used  Substance Use Topics  . Alcohol use: Yes    Comment: 1 glass of wine per week  . Drug use: No     Allergies   Patient has no known allergies.   Review of Systems Review of Systems  Constitutional: Negative for chills and fever.  HENT: Negative for ear pain and sore throat.   Eyes: Negative for pain and visual disturbance.  Respiratory: Negative for cough and shortness of breath.   Cardiovascular: Negative for chest  pain and palpitations.  Gastrointestinal: Positive for abdominal pain and diarrhea (Only from bowel prep). Negative for vomiting.  Genitourinary: Negative for dysuria and hematuria.  Musculoskeletal: Negative for arthralgias and back pain.  Skin: Negative for color change and rash.  Neurological: Negative for seizures, syncope and headaches.  All other systems reviewed and are negative.    Physical Exam Updated Vital Signs BP (!) 153/90   Pulse 75   Temp 98.7 F (37.1 C) (Oral)   Resp 16   SpO2 99%   Physical Exam  Constitutional: She is oriented to person, place, and time. She appears well-developed and well-nourished. No distress.  HENT:  Head: Normocephalic and atraumatic.  Mouth/Throat: Oropharynx is clear and moist.  Eyes:  Conjunctivae are normal.  Neck: Neck supple.  Cardiovascular: Normal rate and regular rhythm.  No murmur heard. Pulses:      Radial pulses are 2+ on the right side, and 2+ on the left side.       Posterior tibial pulses are 2+ on the right side, and 2+ on the left side.  Pulmonary/Chest: Effort normal and breath sounds normal. No respiratory distress.  Abdominal: Soft. There is tenderness in the left lower quadrant. There is guarding. There is no rigidity and no rebound.  Small non painful umbilical hernia present.    Musculoskeletal: She exhibits no edema.  Neurological: She is alert and oriented to person, place, and time.  Skin: Skin is warm and dry.  Psychiatric: She has a normal mood and affect. Her behavior is normal.  Nursing note and vitals reviewed.    ED Treatments / Results  Labs (all labs ordered are listed, but only abnormal results are displayed) Labs Reviewed  COMPREHENSIVE METABOLIC PANEL  CBC WITH DIFFERENTIAL/PLATELET  LIPASE, BLOOD  URINALYSIS, ROUTINE W REFLEX MICROSCOPIC    EKG  EKG Interpretation None       Radiology Ct Abdomen Pelvis W Contrast  Result Date: 08/28/2017 CLINICAL DATA:  Left lower quadrant pain since November 25th. Normal colonoscopy yesterday. Evaluate for diverticulitis. EXAM: CT ABDOMEN AND PELVIS WITH CONTRAST TECHNIQUE: Multidetector CT imaging of the abdomen and pelvis was performed using the standard protocol following bolus administration of intravenous contrast. CONTRAST:  100 cc of Isovue-300 COMPARISON:  None. FINDINGS: Lower chest: Tiny fat containing right sided Bochdalek's hernia. Normal heart size without pericardial or pleural effusion. Hepatobiliary: Normal liver. Cholecystectomy, without biliary ductal dilatation. Pancreas: Normal, without mass or ductal dilatation. Spleen: Old granulomatous disease in the spleen. Adrenals/Urinary Tract: Normal adrenal glands. Normal kidneys, without hydronephrosis. Normal urinary bladder.  Stomach/Bowel: Normal stomach, without wall thickening. Scattered colonic diverticula. Normal terminal ileum and appendix. Proximal small bowel loops are contrast filled and mildly dilated. Dilated bowel continues to the level of a left-sided Spigelian hernia, causing partial small bowel obstruction including on image 58/series 2. No findings to suggest complicating ischemia. Decompressed distal small bowel. Vascular/Lymphatic: Normal caliber of the aorta and branch vessels. No abdominopelvic adenopathy. Reproductive: Normal uterus and adnexa. Other: No significant free fluid. Mild pelvic floor laxity. Fat containing ventral/ periumbilical hernia is small. Musculoskeletal: No acute osseous abnormality. IMPRESSION: Left-sided Spigelian hernia, containing partially obstructive mid small bowel. No complicating ischemia. These results will be called to the ordering clinician or representative by the Radiologist Assistant, and communication documented in the PACS or zVision Dashboard. Electronically Signed   By: Abigail Miyamoto M.D.   On: 08/28/2017 15:32    Procedures Procedures (including critical care time)  Medications Ordered in ED Medications  morphine  4 MG/ML injection 4 mg (not administered)  ondansetron (ZOFRAN) injection 4 mg (not administered)  sodium chloride 0.9 % bolus 1,000 mL (1,000 mLs Intravenous New Bag/Given 08/28/17 1638)     Initial Impression / Assessment and Plan / ED Course  I have reviewed the triage vital signs and the nursing notes.  Pertinent labs & imaging results that were available during my care of the patient were reviewed by me and considered in my medical decision making (see chart for details).     Fraidy C Randazzo presents today for evaluation of left lower quadrant abdominal pain after she had a CT scan this afternoon showing an left sided Spigelian hernia with partially obstructive mid small bowel.  He had a colonoscopy yesterday with Dr. Dema Severin who ordered the CT scan  today.  General surgery was involved prior to my evaluation of the patient and was assessing her when I saw her.  Dr. Harlow Asa with  general surgery will admit with possible operative intervention tomorrow. Orders placed for fluid bolus, pain medicine, zofran PRN, and baseline labs.     Final Clinical Impressions(s) / ED Diagnoses   Final diagnoses:  Spigelian hernia    ED Discharge Orders    None       Ollen Gross 08/28/17 1703    Milton Ferguson, MD 08/28/17 2333

## 2017-08-28 NOTE — Telephone Encounter (Signed)
[facility]  Patient notified to head there now to begin drinking contrast for a 3:oo study. She is notified to not have any solids to eat.

## 2017-08-28 NOTE — H&P (Deleted)
CC: Endoscopically unresectable cecal polyp referred to us by Dr. Rhea BeltonPyrtle  HPI: Ms. Victoria Randall is a very pleasant 62yo lady with hx of HTN, DM (insulin +metformin), GERD, PCOS who underwent: Screening colonoscopy 07/28/17 by Dr. Rhea BeltonPyrtle - she was found to have 1. >5 cm polyp in the cecum which was sessile, biopsied, and tattoo injected distal to this carpeting polyp with 4 cc of spot. Biopsies returned TVA without HGD 2. 2.5cm polyp in asc colon completely removed (TA) 3. 3 sessile polyps in ascending colon 3-466mm in size, all removed and TAs 4. 8 sessile polyps in transverse colon 3-767mm in size all removed and TAs 5. Sigmoid and desc diverticulosis, internal hemorrhoids  She was then referred to us for further evaluation.  She denies any complaints today.  She denies any history of abdominal pain, nausea, vomiting, diarrhea. Her last colonoscopy prior to this one was 12 years ago and she reports it being normal no polyps.  PMH: DM (well controlled on insulin and metformin), HTN, GERD, PCOS PSH: -Partial hysterectomy (transvaginal)-ovaries still in place -Appendectomy, open, via right lower quadrant incision at 62 years of age -Femoral hernia repaired without mesh approximately 30 years ago  FHx: Denies FHx of malignancy Social: Denies use of tobacco/EtOH/drugs.  She is partially retired, works as an Airline pilotaccountant [place] ROS: A comprehensive 10 system review of systems was completed with the patient and pertinent findings as noted above.  Past Surgical History [name]([name], [place]; 08/28/2017 3:35 PM) Appendectomy   Colon Polyp Removal - Colonoscopy   Hysterectomy (not due to cancer) - Partial   Open Inguinal Hernia Surgery   Right.  Diagnostic Studies History [name]([name], [place]; 08/28/2017 3:35 PM) Colonoscopy   within last year Mammogram   within last year Pap Smear   1-5 years ago  Allergies [name]([name], RMA; 08/28/2017 3:39 PM) Corticosteroids   Tetracyclines & Related   Flagyl  *ANTI-INFECTIVE AGENTS - MISC.*   Allergies Reconciled    Medication History [name]([name], [place]; 08/28/2017 3:42 PM) Levemir FlexTouch  (100UNIT/ML Soln Pen-inj, Subcutaneous) Active. MetFORMIN HCl  (500MG  Tablet, Oral) Active. Lisinopril-Hydrochlorothiazide  (20-12.5MG  Tablet, Oral) Active. Claritin  (10MG  Capsule, Oral) Active. Multi-Vitamin  (Oral) Active. NexIUM  (10MG  Packet, Oral) Active. Medications Reconciled   Social History [name]([name], [place]; 08/28/2017 3:35 PM) Caffeine use   Carbonated beverages. No alcohol use   No drug use   Tobacco use   Former smoker.  Family History [name]([name], [place]; 08/28/2017 3:35 PM) Arthritis   Mother. Diabetes Mellitus   Brother, Father. Heart Disease   Father. Heart disease in female family member before age 64    Pregnancy / Birth History [name]([name], [place]; 08/28/2017 3:35 PM) Age at menarche   11 years. Age of menopause   1151-55 Gravida   0 Irregular periods   Para   0  Other Problems [name]([name], [place]; 08/28/2017 3:35 PM) Diabetes Mellitus   Gastroesophageal Reflux Disease   High blood pressure      Review of Systems [name]([name] RMA; 08/28/2017 3:35 PM) General Not Present- Appetite Loss, Chills, Fatigue, Fever, Night Sweats, Weight Gain and Weight Loss. Skin Not Present- Change in Wart/Mole, Dryness, Hives, Jaundice, New Lesions, Non-Healing Wounds, Rash and Ulcer. HEENT Present- Hoarseness and Wears glasses/contact lenses. Not Present- Earache, Hearing Loss, Nose Bleed, Oral Ulcers, Ringing in the Ears, Seasonal Allergies, Sinus Pain, Sore Throat, Visual Disturbances and Yellow Eyes. Breast Not Present- Breast Mass, Breast Pain, Nipple Discharge and Skin Changes. Cardiovascular Present- Leg  Cramps. Not Present- Chest Pain, Difficulty Breathing Lying Down, Palpitations, Rapid Heart Rate, Shortness of Breath and Swelling of Extremities. Gastrointestinal Not Present- Abdominal Pain, Bloating, Bloody Stool, Change in Bowel Habits,  Chronic diarrhea, Constipation, Difficulty Swallowing, Excessive gas, Gets full quickly at meals, Hemorrhoids, Indigestion, Nausea, Rectal Pain and Vomiting. Female Genitourinary Not Present- Frequency, Nocturia, Painful Urination, Pelvic Pain and Urgency. Musculoskeletal Not Present- Back Pain, Joint Pain, Joint Stiffness, Muscle Pain, Muscle Weakness and Swelling of Extremities. Neurological Not Present- Decreased Memory, Fainting, Headaches, Numbness, Seizures, Tingling, Tremor, Trouble walking and Weakness. Endocrine Not Present- Cold Intolerance, Excessive Hunger, Hair Changes, Heat Intolerance, Hot flashes and New Diabetes. Hematology Not Present- Blood Thinners, Easy Bruising, Excessive bleeding, Gland problems, HIV and Persistent Infections.  Vitals [name] RMA; 08/28/2017 3:37 PM) 08/28/2017 3:36 PM Weight: 236 lb   Height: 61 in  Body Surface Area: 2.03 m   Body Mass Index: 44.59 kg/m   Temp.: 97 F    Pulse: 88 (Regular)    BP: 145/82 (Sitting, Left Arm, Standard)   Physical Exam Cristal Deer M. Seydina Holliman MD; 08/28/2017 4:40 PM) The physical exam findings are as follows: Note: Constitutional: No acute distress; conversant; no deformities Eyes: Moist conjunctiva; no lid lag; anicteric sclerae; pupils equal round and reactive to light Neck: Trachea midline; no palpable thyromegaly Lungs: Normal respiratory effort; no tactile fremitus CV: Regular rate and rhythm; no palpable thrill; no pitting edema GI: Obese; abdomen soft, nontender, nondistended; no palpable hepatosplenomegaly MSK: Normal gait; no clubbing/cyanosis Psychiatric: Appropriate affect; alert and oriented 3 Lymphatic: No palpable cervical or axillary lymphadenopathy  Assessment & Plan Cristal Deer M. Shihab States MD; 08/28/2017 4:45 PM) POLYP, COLONIC (K63.5) Impression: Victoria Randall is a very pleasant 62yo lady with hx of morbid obesity, HTN, DM, GERD, PCOS sent for evaluation of a endoscopically unresectable cecal polyp.  Biopsies thus far have not revealed cancer. -Will plan to schedule for laparoscopic possible open right hemicolectomy -She is returning to her primary care physician who is also a cardiologist in [place] prior to surgery but we discussed that we would proceed with scheduling at this time -Preop labs, EKG, mechanical+antibiotic preop bowel prep -The anatomy and physiology of the GI tract was discussed at length with pictures and diagrams accompanying. The pathophysiology and natural course of colon polyps as well as colon cancer was discussed. The role of surgery in managing this problem was discussed. We discussed minimally invasive techniques as well as an open approach should it be necessary. -The planned procedure, material risks (including, but not limited to, pain, bleeding, infection, scarring, need for blood transfusion, damage to surrounding structures- blood vessels/nerves/viscus/organs, damage to ureter, urine leak, leak from anastomosis, need for additional procedures, need for stoma which may be permanent, hernia, recurrence, pneumonia, heart attack, stroke, death) benefits and alternatives to surgery were discussed at length. Her questions were answered to her satisfaction and she elected to proceed with surgery. -An informational handout was additionally provided on colon polyps/cancer and surgical approaches to this  Signed electronically by Andria Meuse, MD (08/28/2017 4:46 PM)

## 2017-08-28 NOTE — Telephone Encounter (Signed)
Patient recently seen by Doug SouJessica Zehr, PA for LLQ pain and was scheduled for a colonoscopy.  She had the colonoscopy yesterday with [facility] Dr. Cliffton AstersWhite.  Patient reports same crampy LLQ pain that returned last night.  She took vicodin last night and pain improved, but now is back.  Same pain that took her to the ED.  She is scheduled to see Doug SouJessica Zehr, PA today at 1;30.  She is instructed to contact Dr. Lucilla LameWhite's office as well.

## 2017-08-28 NOTE — H&P (Signed)
Victoria Randall is an 62 y.o. female.   Chief Complaint: LLQ pain  HPI: The patient is a 62 year old female who started having left lower quadrant pain after Thanksgiving.  She had 2 episodes within a few days.  She is just moving to the area and saw Dr. Donnie Mesa on 08/01/17 and nothing was found.  She had a CT scan on 07/20/2017 with nonspecific prominent mildly dilated fluid loops of small bowel in the left hemidiaphragm the central abdomen suggesting enteritis or early possible small bowel obstruction.  She did well and did not have any further pain until 12/22 she had another episode of pain.  Pain lasts several hours.  Dr. Georgette Dover referred her for colonoscopy and that was actually done yesterday by Dr. Nadeen Landau.  This showed one 3 mm polyp in the sigmoid colon which was removed with cold biopsy forceps.  There were 2 2-3 mm polyps in the rectum was also removed.  There is some erythematous mucosa in the transverse colon the ileum was normal.  The rectum showed mild inflammation and biopsies were taken also.   Pain reoccurred last PM and early this AM, she woke up with extreme pain in the left lower quadrant.  She was seen by [place] and had a CT scan which showed a LLQ Spigelian hernia and at the time the small bowel was visible.  She was brought to the ED and now the hernia has reduced.  She remains extremely tender in the left lower quadrant but no palpable hernias found.  She does have a small umbilical hernia.  Past Medical History:  Diagnosis Date  . Gallstones   . GERD (gastroesophageal reflux disease)    hx of  . Hypothyroidism     Past Surgical History:  Procedure Laterality Date  . CHOLECYSTECTOMY      Family History  Problem Relation Age of Onset  . Other Mother        squamous cell tumor  . Lung cancer Father   . Heart disease Brother    Social History:  reports that she quit smoking about 32 years ago. She quit after 10.00 years of use. she has never used smokeless  tobacco. She reports that she drinks alcohol. She reports that she does not use drugs.  Allergies: No Known Allergies   Prior to Admission medications   Medication Sig Start Date End Date Taking? Authorizing Provider  acetaminophen (TYLENOL) 500 MG tablet Take 1,000 mg by mouth daily as needed for mild pain.     [provider]  aspirin EC 81 MG tablet Take 1 tablet (81 mg total) by mouth daily. 09/03/17   Ileana Roup, MD  levothyroxine (SYNTHROID, LEVOTHROID) 200 MCG tablet Take 200 mcg by mouth daily before breakfast.    [provider]  Na Sulfate-K Sulfate-Mg Sulf (SUPREP BOWEL PREP KIT) 17.5-3.13-1.6 GM/177ML SOLN Take 1 kit by mouth as directed. 08/22/17   Zehr, Janett Billow D, PA-C  naproxen sodium (ALEVE) 220 MG tablet Take 1 tablet (220 mg total) by mouth daily as needed (pain). 09/03/17   Ileana Roup, MD  polyethylene glycol powder (GLYCOLAX/MIRALAX) powder Take 17 g by mouth daily.     [provider]     No results found for this or any previous visit (from the past 48 hour(s)). Ct Abdomen Pelvis W Contrast  Result Date: 08/28/2017 CLINICAL DATA:  Left lower quadrant pain since November 25th. Normal colonoscopy yesterday. Evaluate for diverticulitis. EXAM: CT ABDOMEN AND PELVIS  WITH CONTRAST TECHNIQUE: Multidetector CT imaging of the abdomen and pelvis was performed using the standard protocol following bolus administration of intravenous contrast. CONTRAST:  100 cc of Isovue-300 COMPARISON:  None. FINDINGS: Lower chest: Tiny fat containing right sided Bochdalek's hernia. Normal heart size without pericardial or pleural effusion. Hepatobiliary: Normal liver. Cholecystectomy, without biliary ductal dilatation. Pancreas: Normal, without mass or ductal dilatation. Spleen: Old granulomatous disease in the spleen. Adrenals/Urinary Tract: Normal adrenal glands. Normal kidneys, without hydronephrosis. Normal urinary bladder. Stomach/Bowel: Normal stomach,  without wall thickening. Scattered colonic diverticula. Normal terminal ileum and appendix. Proximal small bowel loops are contrast filled and mildly dilated. Dilated bowel continues to the level of a left-sided Spigelian hernia, causing partial small bowel obstruction including on image 58/series 2. No findings to suggest complicating ischemia. Decompressed distal small bowel. Vascular/Lymphatic: Normal caliber of the aorta and branch vessels. No abdominopelvic adenopathy. Reproductive: Normal uterus and adnexa. Other: No significant free fluid. Mild pelvic floor laxity. Fat containing ventral/ periumbilical hernia is small. Musculoskeletal: No acute osseous abnormality. IMPRESSION: Left-sided Spigelian hernia, containing partially obstructive mid small bowel. No complicating ischemia. These results will be called to the ordering clinician or representative by the Radiologist Assistant, and communication documented in the PACS or zVision Dashboard. Electronically Signed   By: Abigail Miyamoto M.D.   On: 08/28/2017 15:32    Review of Systems  Constitutional: Negative.   HENT: Negative.   Eyes: Negative.   Respiratory: Negative.   Cardiovascular: Negative.   Gastrointestinal: Positive for abdominal pain and nausea. Negative for blood in stool, constipation, diarrhea, heartburn, melena and vomiting.  Genitourinary: Negative.   Musculoskeletal: Negative.   Skin: Negative.   Neurological: Negative.   Endo/Heme/Allergies: Negative.   Psychiatric/Behavioral: Negative.     Blood pressure (!) 153/90, pulse 75, temperature 98.7 F (37.1 C), temperature source Oral, resp. rate 16, SpO2 99 %. Physical Exam  Constitutional: She is oriented to person, place, and time. She appears well-developed and well-nourished. No distress.  HENT:  Head: Normocephalic and atraumatic.  Mouth/Throat: No oropharyngeal exudate.  Eyes: Right eye exhibits no discharge. Left eye exhibits no discharge. No scleral icterus.   Pupils are equal  Neck: Normal range of motion. Neck supple. No JVD present. No tracheal deviation present. No thyromegaly present.  Cardiovascular: Exam reveals no gallop.  Respiratory: No respiratory distress. She has no wheezes. She has no rales. She exhibits no tenderness.  GI: Soft. Bowel sounds are normal. She exhibits no distension and no mass. There is tenderness (LLQ tendernes). There is no guarding.  Umbilical hernia  Lymphadenopathy:    She has no cervical adenopathy.  Neurological: She is alert and oriented to person, place, and time. No cranial nerve deficit.  Skin: Skin is warm and dry. No rash noted. She is not diaphoretic. No erythema. No pallor.  Psychiatric: She has a normal mood and affect. Her behavior is normal. Judgment and thought content normal.     Assessment/Plan LLQ spigelian hernia Umbilical hernia Hypothyroid   Plan:  Admit, pain control, hydrate, Clear liquids tonight, and then we will plan to take her to the OR tomorrow for umbilical and LLQ hernia repair.  Thirza Pellicano, PA-C 08/28/2017, 5:00 PM

## 2017-08-28 NOTE — ED Provider Notes (Signed)
[facility] DEPT Provider Note   CSN: [id number] Arrival date & time: 08/28/17  1615     History   Chief Complaint Chief Complaint  Patient presents with  . Abdominal Pain  . Hernia    HPI Lorane Cousar Nix is a 62 y.o. female who presents today for evaluation of LLQ abdominal pain.  She has been having intermittent left lower quadrant abdominal pain since the end of November.  She had a colonoscopy yesterday to remove polyps.  Last night she had severe pain that responded to pain medicine and then another pain attack this morning.  She reports that she has been having pain since 10 AM.  She had a CT scan obtained at 3 PM showing an incarcerated hernia.    HPI  Past Medical History:  Diagnosis Date  . Gallstones   . GERD (gastroesophageal reflux disease)    hx of  . Hypothyroidism     Patient Active Problem List   Diagnosis Date Noted  . LLQ abdominal pain 08/22/2017  . Special screening for malignant neoplasms, colon 08/22/2017  . Change in bowel habits 08/22/2017    Past Surgical History:  Procedure Laterality Date  . CHOLECYSTECTOMY      OB History    No data available       Home Medications    Prior to Admission medications   Medication Sig Start Date End Date Taking? Authorizing Provider  acetaminophen (TYLENOL) 500 MG tablet Take 1,000 mg by mouth daily as needed for mild pain.     [provider]  aspirin EC 81 MG tablet Take 1 tablet (81 mg total) by mouth daily. 09/03/17   Ileana Roup, MD  levothyroxine (SYNTHROID, LEVOTHROID) 200 MCG tablet Take 200 mcg by mouth daily before breakfast.    [provider]  Na Sulfate-K Sulfate-Mg Sulf (SUPREP BOWEL PREP KIT) 17.5-3.13-1.6 GM/177ML SOLN Take 1 kit by mouth as directed. 08/22/17   Zehr, Janett Billow D, PA-C  naproxen sodium (ALEVE) 220 MG tablet Take 1 tablet (220 mg total) by mouth daily as needed (pain). 09/03/17   Ileana Roup, MD  polyethylene glycol  powder (GLYCOLAX/MIRALAX) powder Take 17 g by mouth daily.     [provider]    Family History Family History  Problem Relation Age of Onset  . Other Mother        squamous cell tumor  . Lung cancer Father   . Heart disease Brother     Social History Social History   Tobacco Use  . Smoking status: Former Smoker    Years: 10.00    Last attempt to quit: 08/22/1985    Years since quitting: 32.0  . Smokeless tobacco: Never Used  Substance Use Topics  . Alcohol use: Yes    Comment: 1 glass of wine per week  . Drug use: No     Allergies   Patient has no known allergies.   Review of Systems Review of Systems  Constitutional: Negative for chills and fever.  HENT: Negative for ear pain and sore throat.   Eyes: Negative for pain and visual disturbance.  Respiratory: Negative for cough and shortness of breath.   Cardiovascular: Negative for chest pain and palpitations.  Gastrointestinal: Positive for abdominal pain. Negative for constipation, nausea and vomiting.  Genitourinary: Negative for dysuria and hematuria.  Musculoskeletal: Negative for arthralgias and back pain.  Skin: Negative for color change and rash.  Neurological: Negative for seizures and syncope.  All other  systems reviewed and are negative.    Physical Exam Updated Vital Signs BP (!) 153/90   Pulse 75   Temp 98.7 F (37.1 C) (Oral)   Resp 16   SpO2 99%   Physical Exam  Constitutional: She is oriented to person, place, and time. She appears well-developed and well-nourished.  Non-toxic appearance. No distress.  HENT:  Head: Normocephalic and atraumatic.  Eyes: Conjunctivae are normal.  Neck: Neck supple.  Cardiovascular: Normal rate, regular rhythm, intact distal pulses and normal pulses.  No murmur heard. Pulses:      Radial pulses are 2+ on the right side, and 2+ on the left side.       Dorsalis pedis pulses are 2+ on the right side, and 2+ on the left side.       Posterior tibial  pulses are 2+ on the right side, and 2+ on the left side.  Pulmonary/Chest: Effort normal and breath sounds normal. No respiratory distress.  Abdominal: Soft. There is tenderness in the left lower quadrant. There is no rigidity, no rebound and no guarding.  Musculoskeletal: She exhibits no edema.  Neurological: She is alert and oriented to person, place, and time.  Sensation intact to bilateral lower extremities.  5/5 ankle dorsi/plantar flexion bilaterally.   Skin: Skin is warm and dry.  Psychiatric: She has a normal mood and affect.  Nursing note and vitals reviewed.    ED Treatments / Results  Labs (all labs ordered are listed, but only abnormal results are displayed) Labs Reviewed  COMPREHENSIVE METABOLIC PANEL - Abnormal; Notable for the following components:      Result Value   Sodium 133 (*)    Chloride 100 (*)    Glucose, Bld 110 (*)    Calcium 8.5 (*)    All other components within normal limits  URINALYSIS, ROUTINE W REFLEX MICROSCOPIC - Abnormal; Notable for the following components:   Color, Urine STRAW (*)    Specific Gravity, Urine 1.041 (*)    Hgb urine dipstick SMALL (*)    Squamous Epithelial / LPF 0-5 (*)    All other components within normal limits  CBC WITH DIFFERENTIAL/PLATELET  LIPASE, BLOOD  CBC  CREATININE, SERUM  HIV ANTIBODY (ROUTINE TESTING)    EKG  EKG Interpretation None       Radiology Ct Abdomen Pelvis W Contrast  Result Date: 08/28/2017 CLINICAL DATA:  Left lower quadrant pain since November 25th. Normal colonoscopy yesterday. Evaluate for diverticulitis. EXAM: CT ABDOMEN AND PELVIS WITH CONTRAST TECHNIQUE: Multidetector CT imaging of the abdomen and pelvis was performed using the standard protocol following bolus administration of intravenous contrast. CONTRAST:  100 cc of Isovue-300 COMPARISON:  None. FINDINGS: Lower chest: Tiny fat containing right sided Bochdalek's hernia. Normal heart size without pericardial or pleural effusion.  Hepatobiliary: Normal liver. Cholecystectomy, without biliary ductal dilatation. Pancreas: Normal, without mass or ductal dilatation. Spleen: Old granulomatous disease in the spleen. Adrenals/Urinary Tract: Normal adrenal glands. Normal kidneys, without hydronephrosis. Normal urinary bladder. Stomach/Bowel: Normal stomach, without wall thickening. Scattered colonic diverticula. Normal terminal ileum and appendix. Proximal small bowel loops are contrast filled and mildly dilated. Dilated bowel continues to the level of a left-sided Spigelian hernia, causing partial small bowel obstruction including on image 58/series 2. No findings to suggest complicating ischemia. Decompressed distal small bowel. Vascular/Lymphatic: Normal caliber of the aorta and branch vessels. No abdominopelvic adenopathy. Reproductive: Normal uterus and adnexa. Other: No significant free fluid. Mild pelvic floor laxity. Fat containing ventral/ periumbilical hernia is  small. Musculoskeletal: No acute osseous abnormality. IMPRESSION: Left-sided Spigelian hernia, containing partially obstructive mid small bowel. No complicating ischemia. These results will be called to the ordering clinician or representative by the Radiologist Assistant, and communication documented in the PACS or zVision Dashboard. Electronically Signed   By: Abigail Miyamoto M.D.   On: 08/28/2017 15:32    Procedures Procedures (including critical care time)  Medications Ordered in ED Medications  sodium chloride 0.9 % bolus 1,000 mL (1,000 mLs Intravenous New Bag/Given 08/28/17 1638)  morphine 4 MG/ML injection 4 mg (4 mg Intravenous Given 08/28/17 1726)  ondansetron (ZOFRAN) injection 4 mg (4 mg Intravenous Given 08/28/17 1726)     Initial Impression / Assessment and Plan / ED Course  I have reviewed the triage vital signs and the nursing notes.  Pertinent labs & imaging results that were available during my care of the patient were reviewed by me and considered in my  medical decision making (see chart for details).    Shannan C Bugaj presents today for evaluation of abdominal pain.  She had a colonoscopy yesterday with polyp removal, and based on increased pain she had a CT ordered prior to arrival.  CT showed spigelian hernia in LLQ.  No free air.  General surgery was already involved in patient's care prior to her arrival at the ED.  General surgery was at bedside assessing the patient when I went to evaluate her.  They plan to admit her for possible operative intervention tomorrow.  Patient hemodynamically stable, her pain and nausea were treated while in the emergency room and she was given fluids.  Orders placed for basic labs.  General surgery to admit.   Final Clinical Impressions(s) / ED Diagnoses   Final diagnoses:  Spigelian hernia    ED Discharge Orders    None       Ollen Gross 08/29/17 Rica Mote, MD 08/31/17 [phone]

## 2017-08-28 NOTE — Addendum Note (Signed)
Addended by: Annett FabianJONES, Danial Hlavac L on: 08/28/2017 12:31 PM   Modules accepted: Orders

## 2017-08-28 NOTE — Telephone Encounter (Signed)
Late entry  Victoria Randall from CT called and indicated that the patient's insurance has denied coverage for a STAT and is in review.  I explained to the patient that if she has the CT she will be charged the full out of pocket price that as of now her insurance has denied coverage.  I explained to her that she should go to the ED for evaluation

## 2017-08-29 ENCOUNTER — Encounter (HOSPITAL_COMMUNITY)
Admission: EM | Disposition: A | Discharge status: Home / Self Care | Payer: Self-pay | Source: Home / Self Care | Attending: Emergency Medicine

## 2017-08-29 ENCOUNTER — Encounter (HOSPITAL_COMMUNITY): Payer: Self-pay | Admitting: Anesthesiology

## 2017-08-29 ENCOUNTER — Observation Stay (HOSPITAL_COMMUNITY): Payer: Managed Care, Other (non HMO) | Admitting: Anesthesiology

## 2017-08-29 HISTORY — PX: UMBILICAL HERNIA REPAIR: SHX196

## 2017-08-29 LAB — HIV ANTIBODY (ROUTINE TESTING W REFLEX): HIV Screen 4th Generation wRfx: NONREACTIVE

## 2017-08-29 SURGERY — REPAIR, HERNIA, UMBILICAL, LAPAROSCOPIC
Anesthesia: General | Site: Abdomen

## 2017-08-29 MED ORDER — ONDANSETRON HCL 4 MG/2ML IJ SOLN
INTRAMUSCULAR | Status: AC
Start: 2017-08-29 — End: 2017-08-29
  Filled 2017-08-29: qty 2

## 2017-08-29 MED ORDER — PROPOFOL 10 MG/ML IV BOLUS
INTRAVENOUS | Status: AC
  Filled 2017-08-29: qty 20

## 2017-08-29 MED ORDER — ONDANSETRON HCL 4 MG/2ML IJ SOLN
INTRAMUSCULAR | Status: DC | PRN
  Administered 2017-08-29: 4 mg via INTRAVENOUS

## 2017-08-29 MED ORDER — MIDAZOLAM HCL 2 MG/2ML IJ SOLN
INTRAMUSCULAR | Status: AC
  Filled 2017-08-29: qty 2

## 2017-08-29 MED ORDER — MEPERIDINE HCL 50 MG/ML IJ SOLN: 6.2500 mg | INTRAMUSCULAR | Status: DC | PRN

## 2017-08-29 MED ORDER — SUGAMMADEX SODIUM 500 MG/5ML IV SOLN
INTRAVENOUS | Status: DC | PRN
  Administered 2017-08-29: 300 mg via INTRAVENOUS

## 2017-08-29 MED ORDER — FENTANYL CITRATE (PF) 100 MCG/2ML IJ SOLN
INTRAMUSCULAR | Status: AC
Start: 2017-08-29 — End: 2017-08-29
  Filled 2017-08-29: qty 2

## 2017-08-29 MED ORDER — PROPOFOL 10 MG/ML IV BOLUS
INTRAVENOUS | Status: DC | PRN
  Administered 2017-08-29: 150 mg via INTRAVENOUS
  Administered 2017-08-29: 50 mg via INTRAVENOUS

## 2017-08-29 MED ORDER — TRAMADOL HCL 50 MG PO TABS: 50.0000 mg | ORAL_TABLET | Freq: Four times a day (QID) | ORAL | Status: DC | PRN

## 2017-08-29 MED ORDER — PROMETHAZINE HCL 25 MG/ML IJ SOLN
6.2500 mg | INTRAMUSCULAR | Status: DC | PRN
Start: 2017-08-29 — End: 2017-08-29

## 2017-08-29 MED ORDER — PHENYLEPHRINE 40 MCG/ML (10ML) SYRINGE FOR IV PUSH (FOR BLOOD PRESSURE SUPPORT)
PREFILLED_SYRINGE | INTRAVENOUS | Status: AC
  Filled 2017-08-29: qty 10

## 2017-08-29 MED ORDER — HYDROCODONE-ACETAMINOPHEN 5-325 MG PO TABS: 1.0000 | ORAL_TABLET | ORAL | Status: DC | PRN

## 2017-08-29 MED ORDER — FENTANYL CITRATE (PF) 100 MCG/2ML IJ SOLN
INTRAMUSCULAR | Status: AC
  Filled 2017-08-29: qty 2

## 2017-08-29 MED ORDER — LACTATED RINGERS IV SOLN
INTRAVENOUS | Status: DC
Start: 2017-08-29 — End: 2017-08-29
  Administered 2017-08-29 (×2): via INTRAVENOUS

## 2017-08-29 MED ORDER — DEXAMETHASONE SODIUM PHOSPHATE 10 MG/ML IJ SOLN
INTRAMUSCULAR | Status: DC | PRN
  Administered 2017-08-29: 10 mg via INTRAVENOUS

## 2017-08-29 MED ORDER — ACETAMINOPHEN 10 MG/ML IV SOLN: 1000.0000 mg | Freq: Once | INTRAVENOUS | Status: DC | PRN

## 2017-08-29 MED ORDER — SUCCINYLCHOLINE CHLORIDE 200 MG/10ML IV SOSY
PREFILLED_SYRINGE | INTRAVENOUS | Status: DC | PRN
  Administered 2017-08-29: 80 mg via INTRAVENOUS

## 2017-08-29 MED ORDER — HYDROMORPHONE HCL 1 MG/ML IJ SOLN: 0.2500 mg | INTRAMUSCULAR | Status: DC | PRN

## 2017-08-29 MED ORDER — LIDOCAINE 2% (20 MG/ML) 5 ML SYRINGE
INTRAMUSCULAR | Status: AC
Start: 2017-08-29 — End: 2017-08-29
  Filled 2017-08-29: qty 5

## 2017-08-29 MED ORDER — DEXAMETHASONE SODIUM PHOSPHATE 10 MG/ML IJ SOLN
INTRAMUSCULAR | Status: AC
  Filled 2017-08-29: qty 1

## 2017-08-29 MED ORDER — PROMETHAZINE HCL 25 MG/ML IJ SOLN
INTRAMUSCULAR | Status: AC
  Administered 2017-08-29: 6.25 mg via INTRAVENOUS
  Filled 2017-08-29: qty 1

## 2017-08-29 MED ORDER — PHENOL 1.4 % MT LIQD
1.0000 | OROMUCOSAL | Status: DC | PRN
  Administered 2017-08-29: 1 via OROMUCOSAL
  Filled 2017-08-29: qty 177

## 2017-08-29 MED ORDER — CEFAZOLIN SODIUM-DEXTROSE 2-4 GM/100ML-% IV SOLN
INTRAVENOUS | Status: AC
  Filled 2017-08-29: qty 100

## 2017-08-29 MED ORDER — FENTANYL CITRATE (PF) 100 MCG/2ML IJ SOLN
INTRAMUSCULAR | Status: DC | PRN
  Administered 2017-08-29 (×4): 50 ug via INTRAVENOUS

## 2017-08-29 MED ORDER — HYDROCODONE-ACETAMINOPHEN 7.5-325 MG PO TABS: 1.0000 | ORAL_TABLET | Freq: Once | ORAL | Status: DC | PRN

## 2017-08-29 MED ORDER — LEVOTHYROXINE SODIUM 100 MCG PO TABS
200.0000 ug | ORAL_TABLET | Freq: Every day | ORAL | Status: DC
  Administered 2017-08-30: 200 ug via ORAL
  Filled 2017-08-29: qty 2

## 2017-08-29 MED ORDER — LIDOCAINE 2% (20 MG/ML) 5 ML SYRINGE
INTRAMUSCULAR | Status: DC | PRN
  Administered 2017-08-29: 80 mg via INTRAVENOUS

## 2017-08-29 MED ORDER — PROMETHAZINE HCL 25 MG/ML IJ SOLN
6.2500 mg | INTRAMUSCULAR | Status: DC
  Administered 2017-08-29: 6.25 mg via INTRAVENOUS

## 2017-08-29 MED ORDER — ACETAMINOPHEN 325 MG PO TABS: 650.0000 mg | ORAL_TABLET | Freq: Four times a day (QID) | ORAL | Status: DC | PRN

## 2017-08-29 MED ORDER — PHENYLEPHRINE 40 MCG/ML (10ML) SYRINGE FOR IV PUSH (FOR BLOOD PRESSURE SUPPORT)
PREFILLED_SYRINGE | INTRAVENOUS | Status: DC | PRN
  Administered 2017-08-29: 40 ug via INTRAVENOUS
  Administered 2017-08-29: 80 ug via INTRAVENOUS

## 2017-08-29 MED ORDER — MIDAZOLAM HCL 5 MG/5ML IJ SOLN
INTRAMUSCULAR | Status: DC | PRN
  Administered 2017-08-29: 1 mg via INTRAVENOUS

## 2017-08-29 MED ORDER — HYDROMORPHONE HCL 1 MG/ML IJ SOLN: 1.0000 mg | INTRAMUSCULAR | Status: DC | PRN

## 2017-08-29 MED ORDER — ONDANSETRON HCL 4 MG/2ML IJ SOLN: 4.0000 mg | Freq: Four times a day (QID) | INTRAMUSCULAR | Status: DC | PRN

## 2017-08-29 MED ORDER — ROCURONIUM BROMIDE 10 MG/ML (PF) SYRINGE
PREFILLED_SYRINGE | INTRAVENOUS | Status: DC | PRN
  Administered 2017-08-29: 20 mg via INTRAVENOUS
  Administered 2017-08-29: 50 mg via INTRAVENOUS

## 2017-08-29 MED ORDER — KCL IN DEXTROSE-NACL 20-5-0.45 MEQ/L-%-% IV SOLN
INTRAVENOUS | Status: DC
  Administered 2017-08-29: 18:00:00 via INTRAVENOUS
  Filled 2017-08-29 (×2): qty 1000

## 2017-08-29 MED ORDER — ONDANSETRON 4 MG PO TBDP: 4.0000 mg | ORAL_TABLET | Freq: Four times a day (QID) | ORAL | Status: DC | PRN

## 2017-08-29 MED ORDER — ACETAMINOPHEN 650 MG RE SUPP: 650.0000 mg | Freq: Four times a day (QID) | RECTAL | Status: DC | PRN

## 2017-08-29 MED ORDER — 0.9 % SODIUM CHLORIDE (POUR BTL) OPTIME
TOPICAL | Status: DC | PRN
  Administered 2017-08-29: 1000 mL

## 2017-08-29 SURGICAL SUPPLY — 49 items
BINDER ABDOMINAL 12 ML 46-62 (SOFTGOODS) IMPLANT
CHLORAPREP W/TINT 26ML (MISCELLANEOUS) ×3 IMPLANT
CLOSURE WOUND 1/2 X4 (GAUZE/BANDAGES/DRESSINGS) ×1
COVER SURGICAL LIGHT HANDLE (MISCELLANEOUS) ×3 IMPLANT
DECANTER SPIKE VIAL GLASS SM (MISCELLANEOUS) ×3 IMPLANT
DEVICE TROCAR PUNCTURE CLOSURE (ENDOMECHANICALS) ×3 IMPLANT
DISSECTOR BLUNT TIP ENDO 5MM (MISCELLANEOUS) IMPLANT
DRAIN CHANNEL RND F F (WOUND CARE) IMPLANT
DRAPE INCISE IOBAN 66X45 STRL (DRAPES) ×3 IMPLANT
ELECT PENCIL ROCKER SW 15FT (MISCELLANEOUS) IMPLANT
ELECT REM PT RETURN 15FT ADLT (MISCELLANEOUS) ×3 IMPLANT
EVACUATOR SILICONE 100CC (DRAIN) IMPLANT
GAUZE SPONGE 4X4 12PLY STRL (GAUZE/BANDAGES/DRESSINGS) ×3 IMPLANT
GLOVE BIOGEL PI IND STRL 7.0 (GLOVE) ×1 IMPLANT
GLOVE BIOGEL PI INDICATOR 7.0 (GLOVE) ×2
GOWN STRL REUS W/TWL LRG LVL3 (GOWN DISPOSABLE) ×3 IMPLANT
GOWN STRL REUS W/TWL XL LVL3 (GOWN DISPOSABLE) ×6 IMPLANT
IRRIG SUCT STRYKERFLOW 2 WTIP (MISCELLANEOUS)
IRRIGATION SUCT STRKRFLW 2 WTP (MISCELLANEOUS) IMPLANT
KIT BASIN OR (CUSTOM PROCEDURE TRAY) ×3 IMPLANT
MARKER SKIN DUAL TIP RULER LAB (MISCELLANEOUS) ×3 IMPLANT
NEEDLE SPNL 22GX3.5 QUINCKE BK (NEEDLE) ×3 IMPLANT
NS IRRIG 1000ML POUR BTL (IV SOLUTION) ×3 IMPLANT
PAD POSITIONING PINK XL (MISCELLANEOUS) IMPLANT
POSITIONER SURGICAL ARM (MISCELLANEOUS) IMPLANT
SHEARS HARMONIC ACE PLUS 36CM (ENDOMECHANICALS) ×3 IMPLANT
SOLUTION ANTI FOG 6CC (MISCELLANEOUS) ×3 IMPLANT
STAPLER VISISTAT 35W (STAPLE) IMPLANT
STRIP CLOSURE SKIN 1/2X4 (GAUZE/BANDAGES/DRESSINGS) ×2 IMPLANT
SUT MNCRL AB 4-0 PS2 18 (SUTURE) ×6 IMPLANT
SUT NOVA 0 T19/GS 22DT (SUTURE) ×15 IMPLANT
SUT NOVA 1 T20/GS 25DT (SUTURE) IMPLANT
SUT NOVA NAB DX-16 0-1 5-0 T12 (SUTURE) IMPLANT
SUT PROLENE 0 CT 1 CR/8 (SUTURE) IMPLANT
SUT VIC AB 3-0 SH 27 (SUTURE) ×2
SUT VIC AB 3-0 SH 27XBRD (SUTURE) ×1 IMPLANT
SUT VIC AB 4-0 SH 18 (SUTURE) ×3 IMPLANT
SUT VICRYL 0 UR6 27IN ABS (SUTURE) ×3 IMPLANT
SYR 30ML LL (SYRINGE) ×3 IMPLANT
TACKER 5MM HERNIA 3.5CML NAB (ENDOMECHANICALS) ×3 IMPLANT
TAPE CLOTH 4X10 WHT NS (GAUZE/BANDAGES/DRESSINGS) IMPLANT
TAPE CLOTH SURG 4X10 WHT LF (GAUZE/BANDAGES/DRESSINGS) ×3 IMPLANT
TOWEL OR 17X26 10 PK STRL BLUE (TOWEL DISPOSABLE) ×3 IMPLANT
TRAY FOLEY W/METER SILVER 16FR (SET/KITS/TRAYS/PACK) ×3 IMPLANT
TRAY LAPAROSCOPIC (CUSTOM PROCEDURE TRAY) ×3 IMPLANT
TROCAR BLADELESS OPT 5 100 (ENDOMECHANICALS) ×3 IMPLANT
TROCAR XCEL NON-BLD 11X100MML (ENDOMECHANICALS) ×3 IMPLANT
TROCAR XCEL UNIV SLVE 11M 100M (ENDOMECHANICALS) IMPLANT
TUBING INSUF HEATED (TUBING) ×3 IMPLANT

## 2017-08-29 NOTE — Brief Op Note (Signed)
08/29/2017  1:49 PM  PATIENT:  Victoria Randall  62 y.o. female  PRE-OPERATIVE DIAGNOSIS:  spigelian and umbilical hernia  POST-OPERATIVE DIAGNOSIS:  spigelian and umbilical hernia  PROCEDURE:  Procedure(s): DIAGNOSTIC LAPAROSCOPY PRIMARY REPAIR OF LEFT SPIGELIAN HERNIA  AND PRIMARY REPAIR OF UMBILICAL HERNIA (N/A)  SURGEON:  Surgeon(s) and Role:    * Darnell LevelGerkin, Victoria Schwenn, MD - Primary    * White, Stephanie Couphristopher M, MD - Assisting  ANESTHESIA:   general  EBL:  10 mL   BLOOD ADMINISTERED:none  DRAINS: none   LOCAL MEDICATIONS USED:  NONE  SPECIMEN:  No Specimen  DISPOSITION OF SPECIMEN:  N/A  COUNTS:  YES  TOURNIQUET:  * No tourniquets in log *  DICTATION: .Other Dictation: Dictation Number [id number]  PLAN OF CARE: Admit for overnight observation  PATIENT DISPOSITION:  PACU - hemodynamically stable.   Delay start of Pharmacological VTE agent (>24hrs) due to surgical blood loss or risk of bleeding: yes  Darnell Levelodd Blayde Bacigalupi, MD [facility] Office: [phone]

## 2017-08-29 NOTE — Transfer of Care (Signed)
Immediate Anesthesia Transfer of Care Note  Patient: Victoria SheehanDenise C Randall  Procedure(s) Performed: Procedure(s): DIAGNOSTIC LAPAROSCOPY PRIMARY REPAIR OF LEFT SPIGELIAN HERNIA  AND PRIMARY REPAIR OF UMBILICAL HERNIA (N/A)  Patient Location: PACU  Anesthesia Type:General  Level of Consciousness:  sedated, patient cooperative and responds to stimulation  Airway & Oxygen Therapy:Patient Spontanous Breathing and Patient connected to face mask oxgen  Post-op Assessment:  Report given to PACU RN and Post -op Vital signs reviewed and stable  Post vital signs:  Reviewed and stable  Last Vitals:  Vitals:   08/29/17 0510 08/29/17 1114  BP: 134/77 140/82  Pulse: 81 97  Resp: 16 18  Temp: 37.2 C 37.9 C  SpO2: 94% 99%    Complications: No apparent anesthesia complications

## 2017-08-29 NOTE — Progress Notes (Signed)
    CC:  LLQ pain  Subjective: She says she vomited all last PM.  No pain now or really last PM, just nausea and vomiting.  She is fine this AM.    Objective: Vital signs in last 24 hours: Temp:  [98.7 F (37.1 C)-99.3 F (37.4 C)] 98.9 F (37.2 C) (01/04 0510) Pulse Rate:  [75-82] 81 (01/04 0510) Resp:  [16-18] 16 (01/04 0510) BP: (134-174)/(77-90) 134/77 (01/04 0510) SpO2:  [94 %-99 %] 94 % (01/04 0510) Weight:  [77.1 kg (169 lb 15.6 oz)] 77.1 kg (169 lb 15.6 oz) (01/03 1839) Last BM Date: 08/27/17 120 PO recorded 1048 IV Voided x 2  Emesis x 1 Afebrile, VSS   Intake/Output from previous [facility]: 01/03 0701 - 01/04 0700 In: 1168.3 [P.O.:120; I.V.:1048.3] Out: -  Intake/Output this shift: No intake/output data recorded.  General appearance: alert, cooperative and no distress Resp: clear to auscultation bilaterally GI: soft, non-tender; bowel sounds normal; no masses,  no organomegaly  Lab Results:  Recent Labs    08/28/17 1629 08/28/17 1853  WBC 7.8 7.4  HGB 13.1 13.0  HCT 39.1 38.5  PLT 217 199    BMET Recent Labs    08/28/17 1629 08/28/17 1853  NA 133*  --   K 4.3  --   CL 100*  --   CO2 23  --   GLUCOSE 110*  --   BUN 12  --   CREATININE 0.82 0.75  CALCIUM 8.5*  --    PT/INR No results for input(s): LABPROT, INR in the last 72 hours.  Recent Labs  Lab 08/28/17 1629  AST 38  ALT 20  ALKPHOS 53  BILITOT 1.1  PROT 7.1  ALBUMIN 3.9     Lipase     Component Value Date/Time   LIPASE 22 08/28/2017 1629     Medications:   Assessment/Plan LLQ spigelian hernia Umbilical hernia Hypothyroid   FEN:  IV fluids/NPO ID:  Pre op Ancef DVT:  SCD's Foley:  None Follow up:  Dr; Gerrit FriendsGerkin    Plan:  Surgery later today is scheduled.       LOS: 0 days    Victoria Randall 08/29/2017 [phone]

## 2017-08-29 NOTE — Anesthesia Procedure Notes (Signed)
Procedure Name: Intubation Date/Time: 08/29/2017 12:26 PM Performed by: Anne Fu, CRNA Pre-anesthesia Checklist: Patient identified, Emergency Drugs available, Suction available and Patient being monitored Patient Re-evaluated:Patient Re-evaluated prior to induction Oxygen Delivery Method: Circle system utilized Preoxygenation: Pre-oxygenation with 100% oxygen Induction Type: IV induction, Rapid sequence and Cricoid Pressure applied Laryngoscope Size: Mac and 3 Grade View: Grade I Tube type: Oral Tube size: 7.0 mm Number of attempts: 1 Airway Equipment and Method: Stylet Placement Confirmation: ETT inserted through vocal cords under direct vision,  positive ETCO2 and breath sounds checked- equal and bilateral Secured at: 21 cm Tube secured with: Tape Dental Injury: Teeth and Oropharynx as per pre-operative assessment

## 2017-08-29 NOTE — Anesthesia Preprocedure Evaluation (Signed)
Anesthesia Evaluation  Patient identified by MRN, date of birth, ID band Patient awake    Reviewed: Allergy & Precautions, NPO status , Patient's Chart, lab work & pertinent test results  History of Anesthesia Complications Negative for: history of anesthetic complications  Airway Mallampati: II  TM Distance: >3 FB Neck ROM: Full    Dental  (+) Dental Advisory Given   Pulmonary former smoker,    breath sounds clear to auscultation       Cardiovascular negative cardio ROS   Rhythm:Regular Rate:Normal     Neuro/Psych negative neurological ROS     GI/Hepatic Neg liver ROS, GERD  Controlled,  Endo/Other  Hypothyroidism   Renal/GU negative Renal ROS     Musculoskeletal   Abdominal   Peds  Hematology negative hematology ROS (+)   Anesthesia Other Findings   Reproductive/Obstetrics                             Anesthesia Physical  Anesthesia Plan  ASA: II  Anesthesia Plan: General   Post-op Pain Management:    Induction:   PONV Risk Score and Plan: 2 and Treatment may vary due to age or medical condition, Ondansetron, Dexamethasone and Scopolamine patch - Pre-op  Airway Management Planned: Oral ETT  Additional Equipment:   Intra-op Plan:   Post-operative Plan:   Informed Consent: I have reviewed the patients History and Physical, chart, labs and discussed the procedure including the risks, benefits and alternatives for the proposed anesthesia with the patient or authorized representative who has indicated his/her understanding and acceptance.   Dental advisory given  Plan Discussed with: CRNA and Surgeon  Anesthesia Plan Comments:         Anesthesia Quick Evaluation

## 2017-08-29 NOTE — Anesthesia Postprocedure Evaluation (Signed)
Anesthesia Post Note  Patient: Victoria SheehanDenise C Randall  Procedure(s) Performed: DIAGNOSTIC LAPAROSCOPY PRIMARY REPAIR OF LEFT SPIGELIAN HERNIA  AND PRIMARY REPAIR OF UMBILICAL HERNIA (N/A Abdomen)     Patient location during evaluation: PACU Anesthesia Type: General Level of consciousness: awake and alert Pain management: pain level controlled Vital Signs Assessment: post-procedure vital signs reviewed and stable Respiratory status: spontaneous breathing, nonlabored ventilation, respiratory function stable and patient connected to nasal cannula oxygen Cardiovascular status: blood pressure returned to baseline and stable Postop Assessment: no apparent nausea or vomiting Anesthetic complications: no    Last Vitals:  Vitals:   08/29/17 1413 08/29/17 1415  BP:  (!) 143/80  Pulse: 85 85  Resp: 16 17  Temp:    SpO2: 100% 99%    Last Pain:  Vitals:   08/29/17 1114  TempSrc: Oral  PainSc:                  Trevor IhaStephen A Meara Wiechman

## 2017-08-30 MED ORDER — HYDROCODONE-ACETAMINOPHEN 5-325 MG PO TABS: 1.0000 | ORAL_TABLET | ORAL | 0 refills | Status: DC | PRN

## 2017-08-30 NOTE — Progress Notes (Signed)
Nurse reviewed discharge instructions with pt. Pt verbalized understanding of discharge instructions, follow up appointment and new medications.  No concerns at time of discharge. 

## 2017-08-30 NOTE — Discharge Instructions (Signed)
Open Hernia Repair, Adult  Open hernia repair is a surgical procedure to fix a hernia. A hernia occurs when an internal organ or tissue pushes out through a weak spot in the abdominal wall muscles. Hernias commonly occur in the groin and around the navel.  Most hernias tend to get worse over time. Often, surgery is done to prevent the hernia from becoming bigger, uncomfortable, or an emergency. Emergency surgery may be needed if abdominal contents get stuck in the opening (incarcerated hernia) or the blood supply gets cut off (strangulated hernia). In an open repair, an incision is made in the abdomen to perform the surgery.  Tell a health care provider about:   Any allergies you have.   All medicines you are taking, including vitamins, herbs, eye drops, creams, and over-the-counter medicines.   Any problems you or family members have had with anesthetic medicines.   Any blood or bone disorders you have.   Any surgeries you have had.   Any medical conditions you have, including any recent cold or flu symptoms.   Whether you are pregnant or may be pregnant.  What are the risks?  Generally, this is a safe procedure. However, problems may occur, including:   Long-lasting (chronic) pain.   Bleeding.   Infection.   Damage to the testicle. This can cause shrinking or swelling.   Damage to the bladder, blood vessels, intestine, or nerves near the hernia.   Trouble passing urine.   Allergic reactions to medicines.   Return of the hernia.    What happens before the procedure?  Staying hydrated  Follow instructions from your health care provider about hydration, which may include:   Up to 2 hours before the procedure - you may continue to drink clear liquids, such as water, clear fruit juice, black coffee, and plain tea.    Eating and drinking restrictions  Follow instructions from your health care provider about eating and drinking, which may include:   8 hours before the procedure - stop eating heavy meals  or foods such as meat, fried foods, or fatty foods.   6 hours before the procedure - stop eating light meals or foods, such as toast or cereal.   6 hours before the procedure - stop drinking milk or drinks that contain milk.   2 hours before the procedure - stop drinking clear liquids.    Medicines   Ask your health care provider about:  ? Changing or stopping your regular medicines. This is especially important if you are taking diabetes medicines or blood thinners.  ? Taking medicines such as aspirin and ibuprofen. These medicines can thin your blood. Do not take these medicines before your procedure if your health care provider instructs you not to.   You may be given antibiotic medicine to help prevent infection.  General instructions   You may have blood tests or imaging studies.   Ask your health care provider how your surgical site will be marked or identified.   If you smoke, do not smoke for at least 2 weeks before your procedure or for as long as told by your health care provider.   Let your health care provider know if you develop a cold or any infection before your surgery.   Plan to have someone take you home from the hospital or clinic.   If you will be going home right after the procedure, plan to have someone with you for 24 hours.  What happens during the procedure?     To reduce your risk of infection:  ? Your health care team will wash or sanitize their hands.  ? Your skin will be washed with soap.  ? Hair may be removed from the surgical area.   An IV tube will be inserted into one of your veins.   You will be given one or more of the following:  ? A medicine to help you relax (sedative).  ? A medicine to numb the area (local anesthetic).  ? A medicine to make you fall asleep (general anesthetic).   Your surgeon will make an incision over the hernia.   The tissues of the hernia will be moved back into place.   The edges of the hernia may be stitched together.   The opening in the  abdominal muscles will be closed with stitches (sutures). Or, your surgeon will place a mesh patch made of manmade (synthetic) material over the opening.   The incision will be closed.   A bandage (dressing) may be placed over the incision.  The procedure may vary among health care providers and hospitals.  What happens after the procedure?   Your blood pressure, heart rate, breathing rate, and blood oxygen level will be monitored until the medicines you were given have worn off.   You may be given medicine for pain.   Do not drive for 24 hours if you received a sedative.  This information is not intended to replace advice given to you by your health care provider. Make sure you discuss any questions you have with your health care provider.  Document Released: 02/05/2001 Document Revised: 03/01/2016 Document Reviewed: 01/24/2016  [employer] Interactive Patient Education  2018 [employer]

## 2017-08-30 NOTE — Discharge Summary (Signed)
Physician Discharge Summary  Patient ID: Victoria Randall MRN: [id number] DOB/AGE: 64-Mar-1957 64 y.o.  Admit date: 08/28/2017 Discharge date: 08/30/2017  Admission Diagnoses:  Lower abdominal pain  Discharge Diagnoses:  Spigelian hernia  Active Problems:   Spigelian hernia   Surgery:  Open umbilical and Spigelian hernia repairs on Friday  Discharged Condition: improved  Hospital Course:   Had discovery and repair of Richter's hernia in Spigelian hernia on Friday.  Did well and tolerated liquids and ready for discharge on Saturday.    Consults: none  Significant Diagnostic Studies: none    Discharge Exam: Blood pressure 126/80, pulse 74, temperature 99.6 F (37.6 C), temperature source Oral, resp. rate 18, height 5' 4"  (1.626 m), weight 77.1 kg (169 lb 15.6 oz), SpO2 96 %. Incisions covered  Disposition: 01-Home or Self Care  Discharge Instructions    Call MD for:  persistant nausea and vomiting   Complete by:  As directed    Call MD for:  redness, tenderness, or signs of infection (pain, swelling, redness, odor or green/yellow discharge around incision site)   Complete by:  As directed    Call MD for:  severe uncontrolled pain   Complete by:  As directed    Diet - low sodium heart healthy   Complete by:  As directed    Discharge wound care:   Complete by:  As directed    Use abdominal binder to support abdomen and back May remove dressings and shower when you get home.   Increase activity slowly   Complete by:  As directed      Allergies as of 08/30/2017   No Known Allergies     Medication List    STOP taking these medications   Na Sulfate-K Sulfate-Mg Sulf 17.5-3.13-1.6 GM/177ML Soln Commonly known as:  SUPREP BOWEL PREP KIT     TAKE these medications   acetaminophen 500 MG tablet Commonly known as:  TYLENOL Take 1,000 mg by mouth daily as needed for mild pain.   aspirin EC 81 MG tablet Take 1 tablet (81 mg total) by mouth daily. Start taking on:  09/03/2017    BENEFIBER DRINK MIX PO Take by mouth. MIX WITH 8 OZ OF WATER AND DRINK   bisacodyl 5 MG EC tablet Commonly known as:  DULCOLAX Take 10 mg by mouth once.   HYDROcodone-acetaminophen 5-325 MG tablet Commonly known as:  NORCO/VICODIN Take 1 tablet by mouth every 4 (four) hours as needed for moderate pain. What changed:    how much to take  when to take this  reasons to take this   levothyroxine 200 MCG tablet Commonly known as:  SYNTHROID, LEVOTHROID Take 200 mcg by mouth daily before breakfast.   MIRALAX powder Generic drug:  polyethylene glycol powder Take 1 Container by mouth once.   naproxen sodium 220 MG tablet Commonly known as:  ALEVE Take 1 tablet (220 mg total) by mouth daily as needed (pain). Start taking on:  09/03/2017            Discharge Care Instructions  (From admission, onward)        Start     Ordered   08/30/17 0000  Discharge wound care:    Comments:  Use abdominal binder to support abdomen and back May remove dressings and shower when you get home.   08/30/17 0712     Follow-up Information    Armandina Gemma, MD Follow up.   Specialty:  General Surgery Contact information: [address]  [address] [place] [address] [phone]           Signed: Pedro Earls 08/30/2017, 9:29 AM

## 2017-09-01 NOTE — Op Note (Signed)
NAME:  Victoria DuboisDAY, Victoria Randall                       ACCOUNT NO.:  MEDICAL RECORD NO.:  [id number]  LOCATION:                                 FACILITY:  PHYSICIAN:  Victoria Hecklerodd M Hyland Mollenkopf, MD           DATE OF BIRTH:  DATE OF PROCEDURE:  08/29/2017                              OPERATIVE REPORT   PREOPERATIVE DIAGNOSIS:  Left Spigelian hernia, umbilical hernia.  POSTOPERATIVE DIAGNOSES: 1. 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 2. Umbilical hernia.  PROCEDURES: 1. Diagnostic laparoscopy. 2. Primary repair, left Spigelian hernia. 3. Primary repair, umbilical hernia.  SURGEON:  Victoria Hecklerodd M Deyanira Fesler, MD.  ASSISTANT:  Victoria Olphristopher White, MD  ANESTHESIA:  General.  ESTIMATED BLOOD LOSS:  Minimal.  PREPARATION:  ChloraPrep.  COMPLICATIONS:  None.  INDICATIONS:  The patient is a 62 year old female, who presents with intermittent abdominal pain, nausea and vomiting.  The patient had undergone diagnostic workup including colonoscopy and CT scan.  CT scan demonstrated a left Spigelian hernia containing small bowel.  The patient presented to the emergency department.  On examination, a palpable hernia could not be demonstrated and the patient's pain had resolved.  She was admitted to the Surgical Service in preparation for definitive surgical procedure.  The patient had persistent intermittent nausea and vomiting.  She is now brought to the operating room for diagnostic laparoscopy and hernia repair.  BODY OF REPORT:  Procedure [facility] in OR [facility].  The patient was brought to the operating room and placed in supine position on the operating room table.  Following administration of general anesthesia, the patient was positioned and then prepped and draped in the usual aseptic fashion.  After ascertaining that an adequate Randall of anesthesia had been achieved, an infraumbilical incision was made through an old surgical scar at  the base of the umbilicus.  Skin was mobilized.  Hernia sac was opened and preperitoneal fat was reduced back through the hernia defect.  Hernia defect was dissected out circumferentially and the umbilicus was completely elevated off the abdominal wall.  A 0 pursestring suture was placed in the fascial edges at the umbilicus. Using a hemostat, the peritoneal cavity was entered.  A Hasson cannula was introduced under direct vision and secured with a pursestring suture.  Abdomen was then insufflated with carbon dioxide.  Laparoscope was introduced and the abdomen explored.  In the left lower quadrant, is a loop of small bowel, which enters in anterior abdominal wall hernia at the lateral edge of the rectus muscle representing a Spigelian hernia. The location was marked on the skin with a marking pen.  Next, an incision was made on the external abdominal wall at the site of the Spigelian hernia.  Dissection was carried through subcutaneous tissues to the external oblique fascia.  External oblique fascia was opened and beneath it is a hernia sac containing adipose tissue.  Hernia sac was gently opened and with gentle digital dissection, the hernia sac was explored and the loop of small bowel was reduced back within the peritoneal cavity.  Laparoscope was  then reintroduced and pneumoperitoneum established.  The small bowel was visualized.  It has active peristalsis.  However, there appeared to be a small hematoma on the antimesenteric border at the site where the small bowel was involved in the hernia representing a Richter's hernia.  Therefore, using a Babcock clamp, the loop of small bowel was retrieved and brought through the hernia defect in the left lower quadrant of the abdominal wall.  The small area of hematoma was removed with simply wiping of finger along the bowel.  The underlying bowel wall was mildly contused, but normal and viable.  Bowel was returned to the peritoneal  cavity.  Hernia sac was partially excised.  The peritoneum was closed with a running 0 Vicryl suture.  Internal oblique muscle and fascia were closed with interrupted 0 Novafil sutures.  External oblique fascia was closed with interrupted 0 Novafil sutures.  Subcutaneous tissues were closed with interrupted 3-0 Vicryl sutures.  Skin was closed with a running 4-0 Monocryl subcuticular suture.  Laparoscope was again reintroduced and pneumoperitoneum established. The repair was intact with good closure of the peritoneum.  Again, the small bowel appeared viable with only a slight contusion.  Laparoscope was withdrawn.  Port was withdrawn and pursestring suture were removed. The umbilical hernia defect was then closed with interrupted 0 Novafil simple sutures.  Subcutaneous tissues were closed with interrupted 3-0 Vicryl sutures.  Skin was closed with a running 4-0 Monocryl subcuticular suture.  Wound was washed and dried, and Steri-Strips were applied to both incisions.  Dry gauze dressings were applied.  The patient was awakened from anesthesia and brought to the recovery room.  The patient tolerated the procedure well.   Victoria Level, MD [facility] Surgery Office: [phone]    TMG/MEDQ  D:  08/29/2017  T:  08/29/2017  Job:  [id number]  cc:   Victoria Heckler, MD [address] [place] [address]  Victoria Olp, MD  Victoria Randall, M.D. [address] [place] [address] [address] [place]

## 2017-09-05 ENCOUNTER — Encounter: Payer: 59 | Admitting: Gastroenterology

## 2018-05-29 ENCOUNTER — Telehealth: Payer: Self-pay

## 2018-05-29 NOTE — Telephone Encounter (Signed)
Copied from CRM [id number]. Topic: Appointment Scheduling - New Patient >> May 29, 2018  2:20 PM Angela Nevin wrote: New patient has been scheduled for your office. Provider: Rennie Plowman Date of Appointment:11/11

## 2018-07-06 ENCOUNTER — Encounter: Payer: Self-pay | Admitting: Family

## 2018-07-06 ENCOUNTER — Ambulatory Visit (INDEPENDENT_AMBULATORY_CARE_PROVIDER_SITE_OTHER): Payer: Managed Care, Other (non HMO) | Admitting: Family

## 2018-07-06 VITALS — BP 122/80 | HR 89 | Temp 98.8°F | Resp 16 | Ht 64.0 in | Wt 166.0 lb

## 2018-07-06 DIAGNOSIS — M79672 Pain in left foot: Secondary | ICD-10-CM | POA: Diagnosis not present

## 2018-07-06 DIAGNOSIS — Z0000 Encounter for general adult medical examination without abnormal findings: Secondary | ICD-10-CM | POA: Insufficient documentation

## 2018-07-06 NOTE — Patient Instructions (Addendum)
Please return for complete physical.   Labs today  Suspect osteoarthritis playing a role in foot pain.   Today we discussed referrals, orders. Podiatry, dermatology   I have placed these orders in the system for you.  Please be sure to give [place] a call if you have not heard from our office regarding this. We should hear from [place] within ONE week with information regarding your appointment. If not, please let me know immediately.

## 2018-07-06 NOTE — Assessment & Plan Note (Signed)
Patient will establish with GYN. She will also return for CPE, labs

## 2018-07-06 NOTE — Progress Notes (Signed)
Subjective:    Patient ID: Victoria Randall, female    DOB: Apr 27, 1956, 62 y.o.   MRN: [id number]  CC: Victoria Randall is a 61 y.o. female who presents today to establish care.    HPI: Complains of left top of foot pain, intermittent, 3 months, unchanged.   Not particularly bothersome. Not exquisitely painful. Swelling on the top. Worse at end of the [place] after walking. Swelling is better in the morning.  No pain in the bottom of the foot. H/o of gout flare years ago, however this pain doesn't feel as severe. No numbness  XR of left foot done a month ago, no fracture. 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 No cold/ heat intolerance.   Follows with GYN  Last menses 10 years ago.   Hernia operation 08/2017. No abdominal pain.       HISTORY:  Past Medical History:  Diagnosis Date  . Gallstones   . GERD (gastroesophageal reflux disease)    hx of  . Hypothyroidism    Past Surgical History:  Procedure Laterality Date  . CHOLECYSTECTOMY    . COLONOSCOPY WITH PROPOFOL N/A 08/27/2017   Procedure: COLONOSCOPY WITH PROPOFOL;  Surgeon: Andria Meuse, MD;  Location: [facility] ENDOSCOPY;  Service: General;  Laterality: N/A;  . SPIGELIAN HERNIA  08/2017  . UMBILICAL HERNIA REPAIR N/A 08/29/2017   Procedure: DIAGNOSTIC LAPAROSCOPY PRIMARY REPAIR OF LEFT SPIGELIAN HERNIA  AND PRIMARY REPAIR OF UMBILICAL HERNIA;  Surgeon: Darnell Level, MD;  Location: [facility] ORS;  Service: General;  Laterality: N/A;  . UMBILICAL HERNIA REPAIR     Family History  Problem Relation Age of Onset  . Other Mother        squamous cell tumor  . Squamous cell carcinoma Mother   . Lung cancer Father   . Heart disease Brother     Allergies: Patient has no known allergies. Current Outpatient Medications on File Prior to Visit  Medication Sig Dispense Refill  . aspirin EC 81 MG tablet Take 1 tablet (81 mg total) by mouth daily.    Marland Kitchen levothyroxine (SYNTHROID, LEVOTHROID) 200 MCG tablet  Take 200 mcg by mouth daily before breakfast.    . naproxen sodium (ALEVE) 220 MG tablet Take 1 tablet (220 mg total) by mouth daily as needed (pain).     No current facility-administered medications on file prior to visit.     Social History   Tobacco Use  . Smoking status: Former Smoker    Years: 10.00    Last attempt to quit: 08/22/1985    Years since quitting: 32.8  . Smokeless tobacco: Never Used  Substance Use Topics  . Alcohol use: Yes    Comment: 1 glass of wine per week  . Drug use: No    Review of Systems  Constitutional: Negative for chills and fever.  Respiratory: Negative for cough.   Cardiovascular: Positive for leg swelling. Negative for chest pain and palpitations.  Gastrointestinal: Negative for nausea and vomiting.  Musculoskeletal: Positive for arthralgias (left foot).  Neurological: Negative for numbness.      Objective:    BP 122/80 (BP Location: Left Arm, Patient Position: Sitting, Cuff Size: Normal)   Pulse 89   Temp 98.8 F (37.1 C) (Oral)   Resp 16   Ht 5\' 4"  (1.626 m)   Wt 166 lb (75.3 kg)   SpO2 96%   BMI 28.49 kg/m  BP Readings from Last 3 Encounters:  07/06/18 122/80  08/30/17 126/80  08/27/17 (!) 150/82   Wt Readings from Last 3 Encounters:  07/06/18 166 lb (75.3 kg)  08/28/17 169 lb 15.6 oz (77.1 kg)  08/27/17 170 lb (77.1 kg)    Physical Exam  Constitutional: She appears well-developed and well-nourished.  Eyes: Conjunctivae are normal.  Cardiovascular: Normal rate, regular rhythm, normal heart sounds and normal pulses.  No LE edema, palpable cords or masses.  No erythema or increased warmth. No asymmetry in calf size when compared bilaterally    Pulmonary/Chest: Effort normal and breath sounds normal. She has no wheezes. She has no rhonchi. She has no rales.  Musculoskeletal:       Right ankle: She exhibits normal range of motion and no swelling. No tenderness.       Left ankle: She exhibits normal range of motion and no  swelling. No tenderness.       Left foot: There is normal range of motion, no tenderness, no bony tenderness and no swelling.  No tenderness on dorsal aspect of foot. Palpable pedal pulses. No edema.   No podagra.   Neurological: She is alert.  Skin: Skin is warm and dry.  Psychiatric: She has a normal mood and affect. Her speech is normal and behavior is normal. Thought content normal.  Vitals reviewed.      Assessment & Plan:   Problem List Items Addressed This Visit      Other   Encounter for medical examination to establish care    Patient will establish with GYN. She will also return for CPE, labs      Relevant Orders   Ambulatory referral to Dermatology   Left foot pain - Primary    Symptoms most consistent with OA. Presentation does not appear to be gout, however pending further lab evaluation. Pending podiatry consult as well. Will follow      Relevant Orders   Ambulatory referral to Podiatry   Uric acid   C-reactive protein   Sedimentation rate   Basic metabolic panel       I have discontinued Victoria Randall's acetaminophen, polyethylene glycol powder, bisacodyl, Wheat Dextrin (BENEFIBER DRINK MIX PO), and HYDROcodone-acetaminophen. I am also having her maintain her levothyroxine, aspirin EC, and naproxen sodium.   No orders of the defined types were placed in this encounter.   Return precautions given.   Risks, benefits, and alternatives of the medications and treatment plan prescribed today were discussed, and patient expressed understanding.   Education regarding symptom management and diagnosis given to patient on AVS.  Continue to follow with Guse, Janna Arch, FNP for routine health maintenance.   Victoria Randall and I agreed with plan.   Rennie Plowman, FNP

## 2018-07-06 NOTE — Assessment & Plan Note (Signed)
Symptoms most consistent with OA. Presentation does not appear to be gout, however pending further lab evaluation. Pending podiatry consult as well. Will follow

## 2018-07-07 ENCOUNTER — Other Ambulatory Visit: Payer: Self-pay | Admitting: Family

## 2018-07-07 DIAGNOSIS — E039 Hypothyroidism, unspecified: Secondary | ICD-10-CM

## 2018-07-07 DIAGNOSIS — Z0000 Encounter for general adult medical examination without abnormal findings: Secondary | ICD-10-CM

## 2018-07-07 LAB — URIC ACID: Uric Acid, Serum: 4.1 mg/dL (ref 2.4–7.0)

## 2018-07-07 LAB — BASIC METABOLIC PANEL
BUN: 20 mg/dL (ref 6–23)
CHLORIDE: 104 meq/L (ref 96–112)
CO2: 28 mEq/L (ref 19–32)
Calcium: 9.1 mg/dL (ref 8.4–10.5)
Creatinine, Ser: 0.87 mg/dL (ref 0.40–1.20)
GFR: 69.61 mL/min (ref >60.00)
GLUCOSE: 100 mg/dL — AB (ref 70–99)
POTASSIUM: 4 meq/L (ref 3.5–5.1)
Sodium: 140 mEq/L (ref 135–145)

## 2018-07-07 LAB — C-REACTIVE PROTEIN: CRP: 0.2 mg/dL — AB (ref 0.5–20.0)

## 2018-07-07 LAB — SEDIMENTATION RATE: Sed Rate: 10 mm/hr (ref 0–30)

## 2018-07-07 NOTE — Telephone Encounter (Unsigned)
Copied from CRM [phone]. Topic: Quick Communication - Rx Refill/Question >> Jul 07, 2018 10:15 AM Herby AbrahamJohnson, Shiquita C wrote: Medication: Synthroid 200 MCG tablet - pt was seen yesterday. Pt wasn't sent in to pharmacy per conversation with provider.   Has the patient contacted their pharmacy? No  (Agent: If no, request that the patient contact the pharmacy for the refill.) (Agent: If yes, when and what did the pharmacy advise?)  Preferred Pharmacy (with phone number or street name): [id number] - [place], [place] - [address]  Agent: Please be advised that RX refills may take up to 3 business days. We ask that you follow-up with your pharmacy.

## 2018-07-07 NOTE — Telephone Encounter (Signed)
Please review for refill for levothyroxine 200 mcg.  Pt needs a TSH. None found in her chart.  This med was prescribed by a historical provider, no last refill noted.  [id number]  LOV  07/06/18

## 2018-07-08 MED ORDER — LEVOTHYROXINE SODIUM 200 MCG PO TABS: 200.0000 ug | ORAL_TABLET | Freq: Every day | ORAL | 1 refills | Status: DC

## 2018-07-08 NOTE — Telephone Encounter (Signed)
Call pt I refilled synthroid  I also went ahead and ordered CPE labs, including tsh. These are fasting Please have her come in for these and also shcedule her cpe

## 2018-07-09 ENCOUNTER — Ambulatory Visit (INDEPENDENT_AMBULATORY_CARE_PROVIDER_SITE_OTHER): Payer: Managed Care, Other (non HMO) | Admitting: Obstetrics & Gynecology

## 2018-07-09 ENCOUNTER — Encounter: Payer: Self-pay | Admitting: Obstetrics & Gynecology

## 2018-07-09 VITALS — BP 143/96 | HR 72 | Wt 166.4 lb

## 2018-07-09 DIAGNOSIS — Z23 Encounter for immunization: Secondary | ICD-10-CM | POA: Diagnosis not present

## 2018-07-09 DIAGNOSIS — Z1151 Encounter for screening for human papillomavirus (HPV): Secondary | ICD-10-CM | POA: Diagnosis not present

## 2018-07-09 DIAGNOSIS — Z1231 Encounter for screening mammogram for malignant neoplasm of breast: Secondary | ICD-10-CM

## 2018-07-09 DIAGNOSIS — Z124 Encounter for screening for malignant neoplasm of cervix: Secondary | ICD-10-CM

## 2018-07-09 DIAGNOSIS — R319 Hematuria, unspecified: Secondary | ICD-10-CM | POA: Diagnosis not present

## 2018-07-09 DIAGNOSIS — Z01419 Encounter for gynecological examination (general) (routine) without abnormal findings: Secondary | ICD-10-CM

## 2018-07-09 LAB — POCT URINE QUALITATIVE DIPSTICK BLOOD

## 2018-07-09 NOTE — Patient Instructions (Signed)
Preventive Care 40-64 Years, Female Preventive care refers to lifestyle choices and visits with your health care provider that can promote health and wellness. What does preventive care include?  A yearly physical exam. This is also called an annual well check.  Dental exams once or twice a year.  Routine eye exams. Ask your health care provider how often you should have your eyes checked.  Personal lifestyle choices, including: ? Daily care of your teeth and gums. ? Regular physical activity. ? Eating a healthy diet. ? Avoiding tobacco and drug use. ? Limiting alcohol use. ? Practicing safe sex. ? Taking low-dose aspirin daily starting at age 58. ? Taking vitamin and mineral supplements as recommended by your health care provider. What happens during an annual well check? The services and screenings done by your health care provider during your annual well check will depend on your age, overall health, lifestyle risk factors, and family history of disease. Counseling Your health care provider may ask you questions about your:  Alcohol use.  Tobacco use.  Drug use.  Emotional well-being.  Home and relationship well-being.  Sexual activity.  Eating habits.  Work and work Statistician.  Method of birth control.  Menstrual cycle.  Pregnancy history.  Screening You may have the following tests or measurements:  Height, weight, and BMI.  Blood pressure.  Lipid and cholesterol levels. These may be checked every 5 years, or more frequently if you are over 81 years old.  Skin check.  Lung cancer screening. You may have this screening every year starting at age 78 if you have a 30-pack-year history of smoking and currently smoke or have quit within the past 15 years.  Fecal occult blood test (FOBT) of the stool. You may have this test every year starting at age 65.  Flexible sigmoidoscopy or colonoscopy. You may have a sigmoidoscopy every 5 years or a colonoscopy  every 10 years starting at age 30.  Hepatitis C blood test.  Hepatitis B blood test.  Sexually transmitted disease (STD) testing.  Diabetes screening. This is done by checking your blood sugar (glucose) after you have not eaten for a while (fasting). You may have this done every 1-3 years.  Mammogram. This may be done every 1-2 years. Talk to your health care provider about when you should start having regular mammograms. This may depend on whether you have a family history of breast cancer.  BRCA-related cancer screening. This may be done if you have a family history of breast, ovarian, tubal, or peritoneal cancers.  Pelvic exam and Pap test. This may be done every 3 years starting at age 80. Starting at age 36, this may be done every 5 years if you have a Pap test in combination with an HPV test.  Bone density scan. This is done to screen for osteoporosis. You may have this scan if you are at high risk for osteoporosis.  Discuss your test results, treatment options, and if necessary, the need for more tests with your health care provider. Vaccines Your health care provider may recommend certain vaccines, such as:  Influenza vaccine. This is recommended every year.  Tetanus, diphtheria, and acellular pertussis (Tdap, Td) vaccine. You may need a Td booster every 10 years.  Varicella vaccine. You may need this if you have not been vaccinated.  Zoster vaccine. You may need this after age 5.  Measles, mumps, and rubella (MMR) vaccine. You may need at least one dose of MMR if you were born in  1957 or later. You may also need a second dose.  Pneumococcal 13-valent conjugate (PCV13) vaccine. You may need this if you have certain conditions and were not previously vaccinated.  Pneumococcal polysaccharide (PPSV23) vaccine. You may need one or two doses if you smoke cigarettes or if you have certain conditions.  Meningococcal vaccine. You may need this if you have certain  conditions.  Hepatitis A vaccine. You may need this if you have certain conditions or if you travel or work in places where you may be exposed to hepatitis A.  Hepatitis B vaccine. You may need this if you have certain conditions or if you travel or work in places where you may be exposed to hepatitis B.  Haemophilus influenzae type b (Hib) vaccine. You may need this if you have certain conditions.  Talk to your health care provider about which screenings and vaccines you need and how often you need them. This information is not intended to replace advice given to you by your health care provider. Make sure you discuss any questions you have with your health care provider. Document Released: 09/08/2015 Document Revised: 05/01/2016 Document Reviewed: 06/13/2015 Elsevier Interactive Patient Education  2018 [employer]  

## 2018-07-09 NOTE — Progress Notes (Signed)
GYNECOLOGY ANNUAL PREVENTATIVE CARE ENCOUNTER NOTE  Subjective:   Victoria Randall is a 62 y.o. female here for a routine annual gynecologic exam.  Current complaints: brown urine recently. No dysuria or back pain or other UTI symptoms.  Denies abnormal vaginal bleeding, discharge, pelvic pain or other gynecologic concerns.  Not sexually active.    Gynecologic History No LMP recorded. Patient is postmenopausal. Contraception: post menopausal status Last Pap: 2018. Results were: normal with negative HPV Last mammogram: 2018. Results were: normal  Obstetric History OB History  No data available    Past Medical History:  Diagnosis Date  . Gallstones   . GERD (gastroesophageal reflux disease)    hx of  . Hypothyroidism     Past Surgical History:  Procedure Laterality Date  . CHOLECYSTECTOMY    . COLONOSCOPY WITH PROPOFOL N/A 08/27/2017   Procedure: COLONOSCOPY WITH PROPOFOL;  Surgeon: Andria MeuseWhite, Christopher M, MD;  Location: [facility];  Service: General;  Laterality: N/A;  . SPIGELIAN HERNIA  08/2017  . UMBILICAL HERNIA REPAIR N/A 08/29/2017   Procedure: DIAGNOSTIC LAPAROSCOPY PRIMARY REPAIR OF LEFT SPIGELIAN HERNIA  AND PRIMARY REPAIR OF UMBILICAL HERNIA;  Surgeon: Darnell LevelGerkin, Todd, MD;  Location: [facility];  Service: General;  Laterality: N/A;  . UMBILICAL HERNIA REPAIR      Current Outpatient Medications on File Prior to Visit  Medication Sig Dispense Refill  . aspirin EC 81 MG tablet Take 1 tablet (81 mg total) by mouth daily.    Marland Kitchen. levothyroxine (SYNTHROID, LEVOTHROID) 200 MCG tablet Take 1 tablet (200 mcg total) by mouth daily before breakfast. 90 tablet 1  . naproxen sodium (ALEVE) 220 MG tablet Take 1 tablet (220 mg total) by mouth daily as needed (pain).     No current facility-administered medications on file prior to visit.     No Known Allergies  Social History:  reports that she quit smoking about 32 years ago. She quit after 10.00 years of use. She has never used  smokeless tobacco. She reports that she drinks alcohol. She reports that she does not use drugs.  Family History  Problem Relation Age of Onset  . Other Mother        squamous cell tumor  . Squamous cell carcinoma Mother   . Lung cancer Father   . Heart disease Brother     The following portions of the patient's history were reviewed and updated as appropriate: allergies, current medications, past family history, past medical history, past social history, past surgical history and problem list.  Review of Systems Pertinent items noted in HPI and remainder of comprehensive ROS otherwise negative.   Objective:  BP (!) 143/96   Pulse 72   Wt 166 lb 6.4 oz (75.5 kg)   BMI 28.56 kg/m  CONSTITUTIONAL: Well-developed, well-nourished female in no acute distress.  HENT:  Normocephalic, atraumatic, External right and left ear normal. Oropharynx is clear and moist EYES: Conjunctivae and EOM are normal. Pupils are equal, round, and reactive to light. No scleral icterus.  NECK: Normal range of motion, supple, no masses.  Normal thyroid.  SKIN: Skin is warm and dry. No rash noted. Not diaphoretic. No erythema. No pallor. MUSCULOSKELETAL: Normal range of motion. No tenderness.  No cyanosis, clubbing, or edema.  2+ distal pulses. NEUROLOGIC: Alert and oriented to person, place, and time. Normal reflexes, muscle tone coordination. No cranial nerve deficit noted. PSYCHIATRIC: Normal mood and affect. Normal behavior. Normal judgment and thought content. CARDIOVASCULAR: Normal heart rate noted, regular  rhythm RESPIRATORY: Clear to auscultation bilaterally. Effort and breath sounds normal, no problems with respiration noted. BREASTS: Symmetric in size. No masses, skin changes, nipple drainage, or lymphadenopathy. ABDOMEN: Soft, normal bowel sounds, no distention noted.  No tenderness, rebound or guarding.  PELVIC: Normal appearing external genitalia; normal appearing vaginal mucosa and cervix with  mild-moderate atrophy.  No abnormal discharge noted.  Pap smear obtained.  Normal uterine size, no other palpable masses, no uterine or adnexal tenderness.  Results for orders placed or performed in visit on 07/09/18 (from the past 24 hour(s))  POCT urine qual dipstick blood     Status: None   Collection Time: 07/09/18  1:38 PM  Result Value Ref Range   Blood, UA POSTIVE      Assessment and Plan:  1. Hematuria of unknown etiology Urine visually examined, noted to appear as normal yellow urine. Will follow up urine culture evaluation. Could be small kidney stone or other etiology. If worsens, will need Urology referral. - Urine Culture - POCT urine qual dipstick blood  2. Need for Tdap vaccination Tdap given today. Up to date on flu vaccine. - Tdap vaccine greater than or equal to 7yo IM  3. Encounter for screening mammogram for breast cancer Mammogram scheduled - MM 3D SCREEN BREAST BILATERAL; Future  4. Well woman exam with routine gynecological exam - Cytology - PAP Will follow up results of pap smear and manage accordingly. Routine preventative health maintenance measures emphasized. Please refer to After Visit Summary for other counseling recommendations.    Jaynie Collins, MD, FACOG Obstetrician & Gynecologist, [facility] for [employer], [facility]

## 2018-07-10 LAB — CYTOLOGY - PAP
Diagnosis: NEGATIVE
HPV (WINDOPATH): NOT DETECTED

## 2018-07-14 NOTE — Telephone Encounter (Signed)
Patient is scheduled for CPE will get labs done at time of appointment

## 2018-08-05 ENCOUNTER — Ambulatory Visit
Admission: RE | Admit: 2018-08-05 | Discharge: 2018-08-05 | Disposition: A | Discharge status: Home / Self Care | Payer: Managed Care, Other (non HMO) | Source: Ambulatory Visit | Attending: Obstetrics & Gynecology | Admitting: Obstetrics & Gynecology

## 2018-08-05 DIAGNOSIS — Z1231 Encounter for screening mammogram for malignant neoplasm of breast: Secondary | ICD-10-CM | POA: Insufficient documentation

## 2018-08-21 ENCOUNTER — Ambulatory Visit (INDEPENDENT_AMBULATORY_CARE_PROVIDER_SITE_OTHER): Payer: Managed Care, Other (non HMO) | Admitting: Family

## 2018-08-21 ENCOUNTER — Encounter: Payer: Self-pay | Admitting: Family

## 2018-08-21 VITALS — BP 130/84 | HR 87 | Temp 98.3°F | Ht 63.5 in | Wt 160.6 lb

## 2018-08-21 DIAGNOSIS — R319 Hematuria, unspecified: Secondary | ICD-10-CM | POA: Diagnosis not present

## 2018-08-21 DIAGNOSIS — Z0000 Encounter for general adult medical examination without abnormal findings: Secondary | ICD-10-CM

## 2018-08-21 DIAGNOSIS — E039 Hypothyroidism, unspecified: Secondary | ICD-10-CM | POA: Insufficient documentation

## 2018-08-21 DIAGNOSIS — M79671 Pain in right foot: Secondary | ICD-10-CM

## 2018-08-21 DIAGNOSIS — G8929 Other chronic pain: Secondary | ICD-10-CM

## 2018-08-21 DIAGNOSIS — M79672 Pain in left foot: Secondary | ICD-10-CM

## 2018-08-21 LAB — VITAMIN D 25 HYDROXY (VIT D DEFICIENCY, FRACTURES): VITD: 42.41 ng/mL (ref 30.00–100.00)

## 2018-08-21 LAB — URINALYSIS, MICROSCOPIC ONLY: WBC UA: NONE SEEN (ref 0–?)

## 2018-08-21 LAB — COMPREHENSIVE METABOLIC PANEL
ALT: 19 U/L (ref 0–35)
AST: 20 U/L (ref 0–37)
Albumin: 4.3 g/dL (ref 3.5–5.2)
Alkaline Phosphatase: 56 U/L (ref 39–117)
BUN: 17 mg/dL (ref 6–23)
CO2: 28 mEq/L (ref 19–32)
Calcium: 9.5 mg/dL (ref 8.4–10.5)
Chloride: 105 mEq/L (ref 96–112)
Creatinine, Ser: 0.78 mg/dL (ref 0.40–1.20)
GFR: 78.93 mL/min (ref >60.00)
Glucose, Bld: 99 mg/dL (ref 70–99)
Potassium: 4.2 mEq/L (ref 3.5–5.1)
Sodium: 142 mEq/L (ref 135–145)
Total Bilirubin: 0.5 mg/dL (ref 0.2–1.2)
Total Protein: 6.9 g/dL (ref 6.0–8.3)

## 2018-08-21 LAB — CBC
HCT: 40.5 % (ref 36.0–46.0)
Hemoglobin: 13.6 g/dL (ref 12.0–15.0)
MCHC: 33.5 g/dL (ref 30.0–36.0)
MCV: 86.6 fl (ref 78.0–100.0)
Platelets: 193 10*3/uL (ref 150.0–400.0)
RBC: 4.68 Mil/uL (ref 3.87–5.11)
RDW: 12.6 % (ref 11.5–15.5)
WBC: 6.4 10*3/uL (ref 4.0–10.5)

## 2018-08-21 LAB — TSH: TSH: <0.01 u[IU]/mL — ABNORMAL LOW (ref 0.35–4.50)

## 2018-08-21 LAB — LIPID PANEL
CHOLESTEROL: 171 mg/dL (ref 0–200)
HDL: 68.2 mg/dL (ref >39.00)
LDL Cholesterol: 86 mg/dL (ref 0–99)
NonHDL: 102.33
Total CHOL/HDL Ratio: 3
Triglycerides: 83 mg/dL (ref 0.0–149.0)
VLDL: 16.6 mg/dL (ref 0.0–40.0)

## 2018-08-21 LAB — HEMOGLOBIN A1C: HEMOGLOBIN A1C: 5.9 % (ref 4.6–6.5)

## 2018-08-21 MED ORDER — LEVOTHYROXINE SODIUM 200 MCG PO TABS: 200.0000 ug | ORAL_TABLET | Freq: Every day | ORAL | 1 refills | Status: DC

## 2018-08-21 NOTE — Patient Instructions (Addendum)
Let me know about vaccines so we can update your chart.  Today we discussed referrals, orders.  Podiatry   I have placed these orders in the system for you.  Please be sure to give [place] a call if you have not heard from our office regarding this. We should hear from [place] within ONE week with information regarding your appointment. If not, please let me know immediately.     Health Maintenance for Postmenopausal Women Menopause is a normal process in which your reproductive ability comes to an end. This process happens gradually over a span of months to years, usually between the ages of 76 and 42. Menopause is complete when you have missed 12 consecutive menstrual periods. It is important to talk with your health care provider about some of the most common conditions that affect postmenopausal women, such as heart disease, cancer, and bone loss (osteoporosis). Adopting a healthy lifestyle and getting preventive care can help to promote your health and wellness. Those actions can also lower your chances of developing some of these common conditions. What should I know about menopause? During menopause, you may experience a number of symptoms, such as:  Moderate-to-severe hot flashes.  Night sweats.  Decrease in sex drive.  Mood swings.  Headaches.  Tiredness.  Irritability.  Memory problems.  Insomnia. Choosing to treat or not to treat menopausal changes is an individual decision that you make with your health care provider. What should I know about hormone replacement therapy and supplements? Hormone therapy products are effective for treating symptoms that are associated with menopause, such as hot flashes and night sweats. Hormone replacement carries certain risks, especially as you become older. If you are thinking about using estrogen or estrogen with progestin treatments, discuss the benefits and risks with your health care provider. What should I know about heart disease and  stroke? Heart disease, heart attack, and stroke become more likely as you age. This may be due, in part, to the hormonal changes that your body experiences during menopause. These can affect how your body processes dietary fats, triglycerides, and cholesterol. Heart attack and stroke are both medical emergencies. There are many things that you can do to help prevent heart disease and stroke:  Have your blood pressure checked at least every 1-2 years. High blood pressure causes heart disease and increases the risk of stroke.  If you are 24-83 years old, ask your health care provider if you should take aspirin to prevent a heart attack or a stroke.  Do not use any tobacco products, including cigarettes, chewing tobacco, or electronic cigarettes. If you need help quitting, ask your health care provider.  It is important to eat a healthy diet and maintain a healthy weight. ? Be sure to include plenty of vegetables, fruits, low-fat dairy products, and lean protein. ? Avoid eating foods that are high in solid fats, added sugars, or salt (sodium).  Get regular exercise. This is one of the most important things that you can do for your health. ? Try to exercise for at least 150 minutes each week. The type of exercise that you do should increase your heart rate and make you sweat. This is known as moderate-intensity exercise. ? Try to do strengthening exercises at least twice each week. Do these in addition to the moderate-intensity exercise.  Know your numbers.Ask your health care provider to check your cholesterol and your blood glucose. Continue to have your blood tested as directed by your health care provider.  What  should I know about cancer screening? There are several types of cancer. Take the following steps to reduce your risk and to catch any cancer development as early as possible. Breast Cancer  Practice breast self-awareness. ? This means understanding how your breasts normally appear  and feel. ? It also means doing regular breast self-exams. Let your health care provider know about any changes, no matter how small.  If you are 35 or older, have a clinician do a breast exam (clinical breast exam or CBE) every year. Depending on your age, family history, and medical history, it may be recommended that you also have a yearly breast X-ray (mammogram).  If you have a family history of breast cancer, talk with your health care provider about genetic screening.  If you are at high risk for breast cancer, talk with your health care provider about having an MRI and a mammogram every year.  Breast cancer (BRCA) gene test is recommended for women who have family members with BRCA-related cancers. Results of the assessment will determine the need for genetic counseling and BRCA1 and for BRCA2 testing. BRCA-related cancers include these types: ? Breast. This occurs in males or females. ? Ovarian. ? Tubal. This may also be called fallopian tube cancer. ? Cancer of the abdominal or pelvic lining (peritoneal cancer). ? Prostate. ? Pancreatic. Cervical, Uterine, and Ovarian Cancer Your health care provider may recommend that you be screened regularly for cancer of the pelvic organs. These include your ovaries, uterus, and vagina. This screening involves a pelvic exam, which includes checking for microscopic changes to the surface of your cervix (Pap test).  For women ages 21-65, health care providers may recommend a pelvic exam and a Pap test every three years. For women ages 14-65, they may recommend the Pap test and pelvic exam, combined with testing for human papilloma virus (HPV), every five years. Some types of HPV increase your risk of cervical cancer. Testing for HPV may also be done on women of any age who have unclear Pap test results.  Other health care providers may not recommend any screening for nonpregnant women who are considered low risk for pelvic cancer and have no  symptoms. Ask your health care provider if a screening pelvic exam is right for you.  If you have had past treatment for cervical cancer or a condition that could lead to cancer, you need Pap tests and screening for cancer for at least 20 years after your treatment. If Pap tests have been discontinued for you, your risk factors (such as having a new sexual partner) need to be reassessed to determine if you should start having screenings again. Some women have medical problems that increase the chance of getting cervical cancer. In these cases, your health care provider may recommend that you have screening and Pap tests more often.  If you have a family history of uterine cancer or ovarian cancer, talk with your health care provider about genetic screening.  If you have vaginal bleeding after reaching menopause, tell your health care provider.  There are currently no reliable tests available to screen for ovarian cancer. Lung Cancer Lung cancer screening is recommended for adults 87-62 years old who are at high risk for lung cancer because of a history of smoking. A yearly low-dose CT scan of the lungs is recommended if you:  Currently smoke.  Have a history of at least 30 pack-years of smoking and you currently smoke or have quit within the past 15 years. A  pack-year is smoking an average of one pack of cigarettes per [place] for one year. Yearly screening should:  Continue until it has been 15 years since you quit.  Stop if you develop a health problem that would prevent you from having lung cancer treatment. Colorectal Cancer  This type of cancer can be detected and can often be prevented.  Routine colorectal cancer screening usually begins at age 65 and continues through age 25.  If you have risk factors for colon cancer, your health care provider may recommend that you be screened at an earlier age.  If you have a family history of colorectal cancer, talk with your health care provider  about genetic screening.  Your health care provider may also recommend using home test kits to check for hidden blood in your stool.  A small camera at the end of a tube can be used to examine your colon directly (sigmoidoscopy or colonoscopy). This is done to check for the earliest forms of colorectal cancer.  Direct examination of the colon should be repeated every 5-10 years until age 67. However, if early forms of precancerous polyps or small growths are found or if you have a family history or genetic risk for colorectal cancer, you may need to be screened more often. Skin Cancer  Check your skin from head to toe regularly.  Monitor any moles. Be sure to tell your health care provider: ? About any new moles or changes in moles, especially if there is a change in a mole's shape or color. ? If you have a mole that is larger than the size of a pencil eraser.  If any of your family members has a history of skin cancer, especially at a young age, talk with your health care provider about genetic screening.  Always use sunscreen. Apply sunscreen liberally and repeatedly throughout the [place].  Whenever you are outside, protect yourself by wearing long sleeves, pants, a wide-brimmed hat, and sunglasses. What should I know about osteoporosis? Osteoporosis is a condition in which bone destruction happens more quickly than new bone creation. After menopause, you may be at an increased risk for osteoporosis. To help prevent osteoporosis or the bone fractures that can happen because of osteoporosis, the following is recommended:  If you are 59-35 years old, get at least 1,000 mg of calcium and at least 600 mg of vitamin D per Ruffins.  If you are older than age 82 but younger than age 21, get at least 1,200 mg of calcium and at least 600 mg of vitamin D per [place]te.  If you are older than age 40, get at least 1,200 mg of calcium and at least 800 mg of vitamin D per Kovaleski. Smoking and excessive alcohol intake  increase the risk of osteoporosis. Eat foods that are rich in calcium and vitamin D, and do weight-bearing exercises several times each week as directed by your health care provider. What should I know about how menopause affects my mental health? Depression may occur at any age, but it is more common as you become older. Common symptoms of depression include:  Low or sad mood.  Changes in sleep patterns.  Changes in appetite or eating patterns.  Feeling an overall lack of motivation or enjoyment of activities that you previously enjoyed.  Frequent crying spells. Talk with your health care provider if you think that you are experiencing depression. What should I know about immunizations? It is important that you get and maintain your immunizations. These include:  Tetanus, diphtheria, and pertussis (Tdap) booster vaccine.  Influenza every year before the flu season begins.  Pneumonia vaccine.  Shingles vaccine. Your health care provider may also recommend other immunizations. This information is not intended to replace advice given to you by your health care provider. Make sure you discuss any questions you have with your health care provider. Document Released: 10/04/2005 Document Revised: 03/01/2016 Document Reviewed: 05/16/2015 Elsevier Interactive Patient Education  2019 [employer].

## 2018-08-21 NOTE — Assessment & Plan Note (Signed)
Continue regimen.  Pending TSH

## 2018-08-21 NOTE — Assessment & Plan Note (Signed)
Chronic.  Referral to podiatry for further evaluation, management

## 2018-08-21 NOTE — Assessment & Plan Note (Signed)
Asymptomatic.  Former smoker.  Pending UA.

## 2018-08-21 NOTE — Assessment & Plan Note (Signed)
Pap smear is up-to-date with OB/GYN.  Deferred pelvic exam in the absence of complaints, up-to-date Pap smear.  Patient will call us with vaccine history.

## 2018-08-21 NOTE — Progress Notes (Signed)
Subjective:    Patient ID: Victoria Randall, female    DOB: 06-21-1956, 62 y.o.   MRN: [id number]  CC: Victoria Randall is a 62 y.o. female who presents today for physical exam.    HPI: Feels well today. No concerns of depression, anxiety. Does complain of chronic feet pain from "bunions".  Also has noted discoloration of her toenails. Hypothyroidism-compliant medication.  Would like refill today.  No cold/heat intolerance. Blood seen in urine specimen recently with GYN.  No dysuria, frequency, fever.   Colorectal Cancer Screening: UTD ; repeat in 5 years Breast Cancer Screening: Mammogram UTD Cervical Cancer Screening: UTD; done with GYN Dr Victoria Randall. NO vaginal bleeding.  Bone Health screening/DEXA for 65+: No increased fracture risk. Defer screening at this time.  Lung Cancer Screening: Doesn't have 30 year pack year history and age > 55 years. Quit 35 years ago   Immunizations       Tetanus - utd          Labs: Screening labs today. Exercise: Gets regular exercise.  Alcohol use: Occasional Smoking/tobacco use: former smoker.   Skin: pending appointment with dermatology 08/2018  HISTORY:  Past Medical History:  Diagnosis Date  . Gallstones   . GERD (gastroesophageal reflux disease)    hx of  . Hypothyroidism     Past Surgical History:  Procedure Laterality Date  . CHOLECYSTECTOMY    . COLONOSCOPY WITH PROPOFOL N/A 08/27/2017   Procedure: COLONOSCOPY WITH PROPOFOL;  Surgeon: Victoria Meuse, MD;  Location: [facility];  Service: General;  Laterality: N/A;  . SPIGELIAN HERNIA  08/2017  . UMBILICAL HERNIA REPAIR N/A 08/29/2017   Procedure: DIAGNOSTIC LAPAROSCOPY PRIMARY REPAIR OF LEFT SPIGELIAN HERNIA  AND PRIMARY REPAIR OF UMBILICAL HERNIA;  Surgeon: Victoria Level, MD;  Location: [facility];  Service: General;  Laterality: N/A;  . UMBILICAL HERNIA REPAIR     Family History  Problem Relation Age of Onset  . Other Mother        squamous cell tumor  . Squamous cell carcinoma  Mother   . Lung cancer Father   . Heart disease Brother   . Breast cancer Neg Hx   . Colon cancer Neg Hx       ALLERGIES: Patient has no known allergies.  Current Outpatient Medications on File Prior to Visit  Medication Sig Dispense Refill  . aspirin EC 81 MG tablet Take 1 tablet (81 mg total) by mouth daily.    . naproxen sodium (ALEVE) 220 MG tablet Take 1 tablet (220 mg total) by mouth daily as needed (pain).     No current facility-administered medications on file prior to visit.     Social History   Tobacco Use  . Smoking status: Former Smoker    Years: 10.00    Last attempt to quit: 08/22/1985    Years since quitting: 33.0  . Smokeless tobacco: Never Used  Substance Use Topics  . Alcohol use: Yes    Comment: 1 glass of wine per week  . Drug use: No    Review of Systems  Constitutional: Negative for chills, fever and unexpected weight change.  HENT: Negative for congestion.   Respiratory: Negative for cough.   Cardiovascular: Negative for chest pain, palpitations and leg swelling.  Gastrointestinal: Negative for nausea and vomiting.  Musculoskeletal: Negative for arthralgias and myalgias.  Skin: Negative for rash.  Neurological: Negative for numbness and headaches.  Hematological: Negative for adenopathy.  Psychiatric/Behavioral: Negative for confusion.  Objective:    BP 130/84   Pulse 87   Temp 98.3 F (36.8 C) (Oral)   Ht 5' 3.5" (1.613 m)   Wt 160 lb 9.6 oz (72.8 kg)   SpO2 96%   BMI 28.00 kg/m   BP Readings from Last 3 Encounters:  08/21/18 130/84  07/09/18 (!) 143/96  07/06/18 122/80   Wt Readings from Last 3 Encounters:  08/21/18 160 lb 9.6 oz (72.8 kg)  07/09/18 166 lb 6.4 oz (75.5 kg)  07/06/18 166 lb (75.3 kg)    Physical Exam Vitals signs reviewed.  Constitutional:      Appearance: She is well-developed.  Eyes:     Conjunctiva/sclera: Conjunctivae normal.  Neck:     Thyroid: No thyroid mass or thyromegaly.    Cardiovascular:     Rate and Rhythm: Normal rate and regular rhythm.     Pulses: Normal pulses.     Heart sounds: Normal heart sounds.  Pulmonary:     Effort: Pulmonary effort is normal.     Breath sounds: Normal breath sounds. No wheezing, rhonchi or rales.  Chest:     Breasts: Breasts are symmetrical.        Right: No inverted nipple, mass, nipple discharge, skin change or tenderness.        Left: No inverted nipple, mass, nipple discharge, skin change or tenderness.  Lymphadenopathy:     Head:     Right side of head: No submental, submandibular, tonsillar, preauricular, posterior auricular or occipital adenopathy.     Left side of head: No submental, submandibular, tonsillar, preauricular, posterior auricular or occipital adenopathy.     Cervical: No cervical adenopathy.     Right cervical: No superficial, deep or posterior cervical adenopathy.    Left cervical: No superficial, deep or posterior cervical adenopathy.  Skin:    General: Skin is warm and dry.  Neurological:     Mental Status: She is alert.  Psychiatric:        Speech: Speech normal.        Behavior: Behavior normal.        Thought Content: Thought content normal.        Assessment & Plan:   Problem List Items Addressed This Visit      Endocrine   Hypothyroidism    Continue regimen.  Pending TSH      Relevant Medications   levothyroxine (SYNTHROID, LEVOTHROID) 200 MCG tablet     Other   Routine physical examination - Primary    Pap smear is up-to-date with OB/GYN.  Deferred pelvic exam in the absence of complaints, up-to-date Pap smear.  Patient will call us with vaccine history.      Left foot pain    Chronic.  Referral to podiatry for further evaluation, management      Hematuria    Asymptomatic.  Former smoker.  Pending UA.      Relevant Orders   Urine Microscopic    Other Visit Diagnoses    Chronic pain of both feet       Relevant Orders   Ambulatory referral to Podiatry       I  am having Victoria Randall maintain her aspirin EC, naproxen sodium, and levothyroxine.   Meds ordered this encounter  Medications  . levothyroxine (SYNTHROID, LEVOTHROID) 200 MCG tablet    Sig: Take 1 tablet (200 mcg total) by mouth daily before breakfast.    Dispense:  90 tablet    Refill:  1  Return precautions given.   Risks, benefits, and alternatives of the medications and treatment plan prescribed today were discussed, and patient expressed understanding.   Education regarding symptom management and diagnosis given to patient on AVS.   Continue to follow with Allegra GranaArnett, Aylene Acoff G, FNP for routine health maintenance.   Adilee C Duchene and I agreed with plan.   Rennie PlowmanMargaret Yelitza Reach, FNP

## 2018-08-28 ENCOUNTER — Telehealth: Payer: Self-pay

## 2018-08-28 ENCOUNTER — Other Ambulatory Visit: Payer: Self-pay

## 2018-08-28 DIAGNOSIS — E039 Hypothyroidism, unspecified: Secondary | ICD-10-CM

## 2018-08-28 NOTE — Telephone Encounter (Signed)
Copied from CRM [phone]. Topic: General - Other >> Aug 28, 2018  2:09 PM Mickel BaasMcGee, Demi B, NT wrote: Reason for CRM: Patient calling and states that she wants it noted in her chart that she only wants the name brand synthroid when she gets those refills. Please advise.

## 2018-08-30 ENCOUNTER — Other Ambulatory Visit: Payer: Self-pay | Admitting: Family

## 2018-08-30 DIAGNOSIS — E039 Hypothyroidism, unspecified: Secondary | ICD-10-CM

## 2018-09-15 ENCOUNTER — Ambulatory Visit (INDEPENDENT_AMBULATORY_CARE_PROVIDER_SITE_OTHER): Payer: Managed Care, Other (non HMO) | Admitting: Podiatry

## 2018-09-15 ENCOUNTER — Encounter: Payer: Self-pay | Admitting: Podiatry

## 2018-09-15 ENCOUNTER — Ambulatory Visit (INDEPENDENT_AMBULATORY_CARE_PROVIDER_SITE_OTHER): Payer: Managed Care, Other (non HMO)

## 2018-09-15 ENCOUNTER — Other Ambulatory Visit: Payer: Self-pay | Admitting: Podiatry

## 2018-09-15 VITALS — BP 125/77 | HR 99 | Temp 97.5°F

## 2018-09-15 DIAGNOSIS — M778 Other enthesopathies, not elsewhere classified: Secondary | ICD-10-CM

## 2018-09-15 DIAGNOSIS — M779 Enthesopathy, unspecified: Secondary | ICD-10-CM

## 2018-09-15 DIAGNOSIS — M21611 Bunion of right foot: Secondary | ICD-10-CM

## 2018-09-15 DIAGNOSIS — M21612 Bunion of left foot: Secondary | ICD-10-CM

## 2018-09-15 NOTE — Progress Notes (Signed)
   Subjective: 63 year old female presenting today as a new patient with a chief complaint of bilateral bunions that have been worsening for the past 1.5 years. She reports associated pain, left greater than right. The pain worsens when she is on her feet for long periods of time. She has been using moleskin for treatment. Patient is here for further evaluation and treatment.   Past Medical History:  Diagnosis Date  . Gallstones   . GERD (gastroesophageal reflux disease)    hx of  . Hypothyroidism       Objective: Physical Exam General: The patient is alert and oriented x3 in no acute distress.  Dermatology: Skin is cool, dry and supple bilateral lower extremities. Negative for open lesions or macerations.  Vascular: Palpable pedal pulses bilaterally. No edema or erythema noted. Capillary refill within normal limits.  Neurological: Epicritic and protective threshold grossly intact bilaterally.   Musculoskeletal Exam: Clinical evidence of bunion deformity noted to the respective foot. There is moderate pain on palpation range of motion of the first MPJ. Lateral deviation of the hallux noted consistent with hallux abductovalgus.  Radiographic Exam: Increased intermetatarsal angle greater than 15 with a hallux abductus angle greater than 30 noted on AP view. Moderate degenerative changes noted within the first MPJ.  Assessment: 1. HAV w/ bunion deformity bilateral    Plan of Care:  1. Patient was evaluated. X-Rays reviewed. 2. Today we discussed the conservative versus surgical management of bunion deformity. We will try conservative treatment at this time.  3.  Recommended good shoe gear.  4. Continue taking Naproxen as needed.  5. Return to clinic as needed.    Felecia Shelling, DPM [facility]  Dr. Felecia Shelling, DPM    [address]                                        [place], [place] [address]                Office [phone]  Fax [phone]

## 2018-09-22 ENCOUNTER — Telehealth: Payer: Self-pay | Admitting: Family

## 2018-09-22 ENCOUNTER — Other Ambulatory Visit (INDEPENDENT_AMBULATORY_CARE_PROVIDER_SITE_OTHER): Payer: Managed Care, Other (non HMO)

## 2018-09-22 ENCOUNTER — Other Ambulatory Visit: Payer: Self-pay | Admitting: Family

## 2018-09-22 DIAGNOSIS — E039 Hypothyroidism, unspecified: Secondary | ICD-10-CM

## 2018-09-22 LAB — TSH: TSH: <0.01 u[IU]/mL — ABNORMAL LOW (ref 0.35–4.50)

## 2018-09-22 LAB — T4, FREE: Free T4: 1.81 ng/dL — ABNORMAL HIGH (ref 0.60–1.60)

## 2018-09-22 LAB — T3, FREE: T3 FREE: 4.3 pg/mL — AB (ref 2.3–4.2)

## 2018-09-22 MED ORDER — LEVOTHYROXINE SODIUM 175 MCG PO TABS: 175.0000 ug | ORAL_TABLET | Freq: Every day | ORAL | 1 refills | Status: DC

## 2018-09-22 NOTE — Telephone Encounter (Signed)
Pt dropped off a letter from her insurance about a denial on one of her medications. Letter is up front in Arnett's color folder.

## 2018-09-22 NOTE — Telephone Encounter (Signed)
Call patient I am so sorry that she had a bill for vitamin D.  I cannot really offer any help in regards to this.  I submitted order under her routine physical which is typically a routine physical lab.    In the future, I would recommend that she call her insurance company prior to her physical to confirm what labs they will pay for.  In recent years, insurance companies have gotten very tricky about which routine physical labs they will continue to pay for.

## 2018-09-23 NOTE — Telephone Encounter (Signed)
I spoke with patient & she really wanted me to try to dispute this with insurance. I told the patient that I would try to call [employer] & would let her know the outcome.

## 2018-10-05 ENCOUNTER — Encounter: Payer: Managed Care, Other (non HMO) | Admitting: Family

## 2018-10-06 ENCOUNTER — Encounter: Payer: Managed Care, Other (non HMO) | Admitting: Family Medicine

## 2018-11-19 ENCOUNTER — Other Ambulatory Visit (INDEPENDENT_AMBULATORY_CARE_PROVIDER_SITE_OTHER): Payer: Managed Care, Other (non HMO)

## 2018-11-19 ENCOUNTER — Other Ambulatory Visit: Payer: Self-pay

## 2018-11-19 DIAGNOSIS — E039 Hypothyroidism, unspecified: Secondary | ICD-10-CM | POA: Diagnosis not present

## 2018-11-19 DIAGNOSIS — Z0000 Encounter for general adult medical examination without abnormal findings: Secondary | ICD-10-CM | POA: Diagnosis not present

## 2018-11-19 LAB — T4, FREE: Free T4: 1.75 ng/dL — ABNORMAL HIGH (ref 0.60–1.60)

## 2018-11-19 LAB — CBC WITH DIFFERENTIAL/PLATELET
BASOS ABS: 0 10*3/uL (ref 0.0–0.1)
Basophils Relative: 0.4 % (ref 0.0–3.0)
Eosinophils Absolute: 0 10*3/uL (ref 0.0–0.7)
Eosinophils Relative: 0.3 % (ref 0.0–5.0)
HEMATOCRIT: 40.7 % (ref 36.0–46.0)
Hemoglobin: 13.8 g/dL (ref 12.0–15.0)
LYMPHS PCT: 34.4 % (ref 12.0–46.0)
Lymphs Abs: 1.9 10*3/uL (ref 0.7–4.0)
MCHC: 33.9 g/dL (ref 30.0–36.0)
MCV: 84.7 fl (ref 78.0–100.0)
MONOS PCT: 6.3 % (ref 3.0–12.0)
Monocytes Absolute: 0.3 10*3/uL (ref 0.1–1.0)
Neutro Abs: 3.2 10*3/uL (ref 1.4–7.7)
Neutrophils Relative %: 58.6 % (ref 43.0–77.0)
Platelets: 169 10*3/uL (ref 150.0–400.0)
RBC: 4.8 Mil/uL (ref 3.87–5.11)
RDW: 13.7 % (ref 11.5–15.5)
WBC: 5.5 10*3/uL (ref 4.0–10.5)

## 2018-11-19 LAB — T3, FREE: T3, Free: 3.3 pg/mL (ref 2.3–4.2)

## 2018-11-19 LAB — TSH: TSH: <0.01 u[IU]/mL — ABNORMAL LOW (ref 0.35–4.50)

## 2018-11-25 ENCOUNTER — Other Ambulatory Visit: Payer: Self-pay | Admitting: Family

## 2018-11-25 DIAGNOSIS — E039 Hypothyroidism, unspecified: Secondary | ICD-10-CM

## 2018-11-25 MED ORDER — LEVOTHYROXINE SODIUM 150 MCG PO TABS: 150.0000 ug | ORAL_TABLET | Freq: Every day | ORAL | 3 refills | Status: DC

## 2019-05-10 ENCOUNTER — Other Ambulatory Visit: Payer: Self-pay | Admitting: Family

## 2019-05-10 DIAGNOSIS — E039 Hypothyroidism, unspecified: Secondary | ICD-10-CM

## 2019-06-09 ENCOUNTER — Encounter: Payer: Self-pay | Admitting: Radiology

## 2019-09-01 ENCOUNTER — Ambulatory Visit: Payer: Managed Care, Other (non HMO) | Admitting: Family Medicine

## 2019-09-16 ENCOUNTER — Ambulatory Visit (INDEPENDENT_AMBULATORY_CARE_PROVIDER_SITE_OTHER): Payer: Managed Care, Other (non HMO) | Admitting: Family Medicine

## 2019-09-16 ENCOUNTER — Encounter: Payer: Self-pay | Admitting: Family Medicine

## 2019-09-16 ENCOUNTER — Other Ambulatory Visit: Payer: Self-pay

## 2019-09-16 VITALS — BP 136/82 | HR 90 | Temp 97.6°F | Ht 63.5 in | Wt 164.5 lb

## 2019-09-16 DIAGNOSIS — R0609 Other forms of dyspnea: Secondary | ICD-10-CM

## 2019-09-16 DIAGNOSIS — E039 Hypothyroidism, unspecified: Secondary | ICD-10-CM | POA: Diagnosis not present

## 2019-09-16 DIAGNOSIS — R7303 Prediabetes: Secondary | ICD-10-CM | POA: Insufficient documentation

## 2019-09-16 DIAGNOSIS — R7309 Other abnormal glucose: Secondary | ICD-10-CM

## 2019-09-16 DIAGNOSIS — Z8249 Family history of ischemic heart disease and other diseases of the circulatory system: Secondary | ICD-10-CM

## 2019-09-16 DIAGNOSIS — R0600 Dyspnea, unspecified: Secondary | ICD-10-CM

## 2019-09-16 LAB — T4, FREE: Free T4: 1.16 ng/dL (ref 0.60–1.60)

## 2019-09-16 LAB — COMPREHENSIVE METABOLIC PANEL
ALT: 18 U/L (ref 0–35)
AST: 18 U/L (ref 0–37)
Albumin: 4.2 g/dL (ref 3.5–5.2)
Alkaline Phosphatase: 70 U/L (ref 39–117)
BUN: 20 mg/dL (ref 6–23)
CO2: 30 mEq/L (ref 19–32)
Calcium: 9 mg/dL (ref 8.4–10.5)
Chloride: 104 mEq/L (ref 96–112)
Creatinine, Ser: 0.82 mg/dL (ref 0.40–1.20)
GFR: 69.86 mL/min (ref >60.00)
Glucose, Bld: 132 mg/dL — ABNORMAL HIGH (ref 70–99)
Potassium: 4.3 mEq/L (ref 3.5–5.1)
Sodium: 140 mEq/L (ref 135–145)
Total Bilirubin: 0.5 mg/dL (ref 0.2–1.2)
Total Protein: 6.4 g/dL (ref 6.0–8.3)

## 2019-09-16 LAB — LIPID PANEL
Cholesterol: 208 mg/dL — ABNORMAL HIGH (ref 0–200)
HDL: 73.2 mg/dL (ref >39.00)
LDL Cholesterol: 103 mg/dL — ABNORMAL HIGH (ref 0–99)
NonHDL: 134.41
Total CHOL/HDL Ratio: 3
Triglycerides: 159 mg/dL — ABNORMAL HIGH (ref 0.0–149.0)
VLDL: 31.8 mg/dL (ref 0.0–40.0)

## 2019-09-16 LAB — CBC
HCT: 41.6 % (ref 36.0–46.0)
Hemoglobin: 13.8 g/dL (ref 12.0–15.0)
MCHC: 33.1 g/dL (ref 30.0–36.0)
MCV: 89 fl (ref 78.0–100.0)
Platelets: 193 10*3/uL (ref 150.0–400.0)
RBC: 4.68 Mil/uL (ref 3.87–5.11)
RDW: 13.4 % (ref 11.5–15.5)
WBC: 7.3 10*3/uL (ref 4.0–10.5)

## 2019-09-16 LAB — TSH: TSH: 0.03 u[IU]/mL — ABNORMAL LOW (ref 0.35–4.50)

## 2019-09-16 MED ORDER — LEVOTHYROXINE SODIUM 125 MCG PO TABS: 125.0000 ug | ORAL_TABLET | Freq: Every day | ORAL | 3 refills | Status: DC

## 2019-09-16 MED ORDER — ALBUTEROL SULFATE HFA 108 (90 BASE) MCG/ACT IN AERS: 2.0000 | INHALATION_SPRAY | Freq: Four times a day (QID) | RESPIRATORY_TRACT | 0 refills | Status: DC | PRN

## 2019-09-16 NOTE — Addendum Note (Signed)
Addended by: Lynnda Child on: 09/16/2019 05:43 PM   Modules accepted: Orders

## 2019-09-16 NOTE — Progress Notes (Signed)
Subjective:     Victoria Randall is a 64 y.o. female presenting for Transfer of Care (from [facility]), Discuss vaccines (Covid vaccine, Pneumonia vaccine and Shingles.), and Low stemina (has family history of heart disease on mom side)     HPI   #Vaccines - Shingles - got this in 2016 - only 1 dose - Pneumonia shot - got this in 2016 - told not lifetime  #Low Stamina - working out 3-4 times a week - has stairs in her house - office is upstairs - does well getting up and down - noticed it more at the [facility] - with cardio feels strained and exhausted and difficulty catching her breath - has been going for year and has noticed improvement - this morning - ran a lap around a building  - denies CP - endorses heavy breathing - no wheezing - no muscle fatigue - almost immediate with running - typical pace is between a walk and jog (usually 3.5 mph) -- having a hard time with the warm-up activity  #Hypothyroidism - taking 150 mg levothyroxine - no changes in skin - heat/cold - no fast heart rate or anxiety   Review of Systems   Social History   Tobacco Use  Smoking Status Former Smoker  . Packs/Dinkel: 0.25  . Years: 10.00  . Pack years: 2.50  . Types: Cigarettes  . Quit date: 08/22/1985  . Years since quitting: 34.0  Smokeless Tobacco Never Used        Objective:    BP Readings from Last 3 Encounters:  09/16/19 136/82  09/15/18 125/77  08/21/18 130/84   Wt Readings from Last 3 Encounters:  09/16/19 164 lb 8 oz (74.6 kg)  08/21/18 160 lb 9.6 oz (72.8 kg)  07/09/18 166 lb 6.4 oz (75.5 kg)    BP 136/82   Pulse 90   Temp 97.6 F (36.4 C)   Ht 5' 3.5" (1.613 m)   Wt 164 lb 8 oz (74.6 kg)   SpO2 98%   BMI 28.68 kg/m    Physical Exam Constitutional:      General: She is not in acute distress.    Appearance: She is well-developed. She is not diaphoretic.  HENT:     Right Ear: External ear normal.     Left Ear: External ear normal.     Nose: Nose  normal.  Eyes:     Conjunctiva/sclera: Conjunctivae normal.  Neck:     Thyroid: No thyromegaly or thyroid tenderness.  Cardiovascular:     Rate and Rhythm: Normal rate and regular rhythm.     Heart sounds: No murmur.  Pulmonary:     Effort: Pulmonary effort is normal. No respiratory distress.     Breath sounds: Normal breath sounds. No wheezing.  Musculoskeletal:     Cervical back: Neck supple.  Lymphadenopathy:     Cervical: No cervical adenopathy.  Skin:    General: Skin is warm and dry.     Capillary Refill: Capillary refill takes less than 2 seconds.  Neurological:     Mental Status: She is alert. Mental status is at baseline.  Psychiatric:        Mood and Affect: Mood normal.        Behavior: Behavior normal.           Assessment & Plan:   Problem List Items Addressed This Visit      Endocrine   Hypothyroidism - Primary    Over replaced on previous labs.  Will recheck today. Make dose adjustment based on labs. Continue levothyroxine based on results. If still having difficulty getting in normal range will consider endocrine referral      Relevant Orders   TSH   T4, free   T3     Other   Dyspnea on exertion    DDx cardiac vs reactive airway vs anemia. Lower suspicion for exercise fatigue given that patient has been working out x 1 year w/o improvement in symptoms. Will get labs today. Discussed trial of albuterol (pt will consider) cannot get PFTs due to covid and cardiology referral to rule out.       Relevant Medications   albuterol (VENTOLIN HFA) 108 (90 Base) MCG/ACT inhaler   Other Relevant Orders   Comprehensive metabolic panel   CBC   Lipid panel   Ambulatory referral to Cardiology    Other Visit Diagnoses    Family history of MI (myocardial infarction)       Relevant Orders   Lipid panel       Return if symptoms worsen or fail to improve.  Lynnda Child, MD

## 2019-09-16 NOTE — Assessment & Plan Note (Addendum)
Over replaced on previous labs. Will recheck today. Make dose adjustment based on labs. Continue levothyroxine based on results. If still having difficulty getting in normal range will consider endocrine referral

## 2019-09-16 NOTE — Assessment & Plan Note (Signed)
DDx cardiac vs reactive airway vs anemia. Lower suspicion for exercise fatigue given that patient has been working out x 1 year w/o improvement in symptoms. Will get labs today. Discussed trial of albuterol (pt will consider) cannot get PFTs due to covid and cardiology referral to rule out.

## 2019-09-16 NOTE — Patient Instructions (Addendum)
  For Covid shot [email or web address]   Shortness of breath with exercise - try using albuterol inhaler 15-30 minutes before exercise - cardiology referral for stress testing

## 2019-09-20 ENCOUNTER — Other Ambulatory Visit: Payer: Self-pay

## 2019-09-20 ENCOUNTER — Ambulatory Visit (INDEPENDENT_AMBULATORY_CARE_PROVIDER_SITE_OTHER): Payer: Managed Care, Other (non HMO) | Admitting: Cardiovascular Disease

## 2019-09-20 ENCOUNTER — Encounter: Payer: Self-pay | Admitting: Cardiovascular Disease

## 2019-09-20 VITALS — BP 132/88 | HR 76 | Ht 63.5 in | Wt 164.0 lb

## 2019-09-20 DIAGNOSIS — R0600 Dyspnea, unspecified: Secondary | ICD-10-CM | POA: Diagnosis not present

## 2019-09-20 DIAGNOSIS — R7309 Other abnormal glucose: Secondary | ICD-10-CM

## 2019-09-20 DIAGNOSIS — E039 Hypothyroidism, unspecified: Secondary | ICD-10-CM | POA: Diagnosis not present

## 2019-09-20 DIAGNOSIS — Z8249 Family history of ischemic heart disease and other diseases of the circulatory system: Secondary | ICD-10-CM

## 2019-09-20 DIAGNOSIS — E785 Hyperlipidemia, unspecified: Secondary | ICD-10-CM

## 2019-09-20 DIAGNOSIS — R0609 Other forms of dyspnea: Secondary | ICD-10-CM

## 2019-09-20 DIAGNOSIS — Z79899 Other long term (current) drug therapy: Secondary | ICD-10-CM

## 2019-09-20 NOTE — Patient Instructions (Signed)
Medication Instructions:  Continue current medications  *If you need a refill on your cardiac medications before your next appointment, please call your pharmacy*  Lab Work: Fasting Lipid and CMP in 3 Months  If you have labs (blood work) drawn today and your tests are completely normal, you will receive your results only by: Marland Kitchen MyChart Message (if you have MyChart) OR . A paper copy in the mail If you have any lab test that is abnormal or we need to change your treatment, we will call you to review the results.  Testing/Procedures: Your physician has requested that you have an echocardiogram. Echocardiography is a painless test that uses sound waves to create images of your heart. It provides your doctor with information about the size and shape of your heart and how well your heart's chambers and valves are working. This procedure takes approximately one hour. There are no restrictions for this procedure.  Your physician has requested that you have a Coronary Calcium Score. This test is done at our [employer] [employer]  Follow-Up: At [facility], you and your health needs are our priority.  As part of our continuing mission to provide you with exceptional heart care, we have created designated Provider Care Teams.  These Care Teams include your primary Cardiologist (physician) and Advanced Practice Providers (APPs -  Physician Assistants and Nurse Practitioners) who all work together to provide you with the care you need, when you need it.  Your next appointment:   3 month(s)  The format for your next appointment:   In Person  Provider:   Nicki Guadalajara, MD

## 2019-09-20 NOTE — Progress Notes (Signed)
Cardiology Office Note    Date:  09/20/2019   ID:  Victoria Randall, DOB 1956-03-01, MRN [id number]  PCP:  Lesleigh Noe, MD  Cardiologist:  Shelva Majestic, MD   New cardiology evaluation referred through the courtesy of Dr. Waunita Schooner for evaluation of shortness of breath with activity.  History of Present Illness:  Victoria Randall is a 64 y.o. female who has a history of hypothyroidism for 15 to 20 years and has been on thyroid replacement medication.  She is referred through the courtesy of Dr. Waunita Schooner for evaluation of exertional shortness of breath.  Victoria Randall denies any awareness of cardiac disease.  In January 2020 she changed her lifestyle and began to exercise.  Over the past year, she has been participating in boot camp 3 days/week.  Even during the COVID-19 pandemic she has been attending outdoor classes for 45 minutes.  When she initially started working out she noted significant exertional dyspnea.  However, despite working out for a year she believes she has not noticed any significant improvement in her exertional shortness of breath.  She was recently evaluated by Waunita Schooner.  She denied any associated wheezing.  She feels drained and exhausted at times has difficulty catching her breath with the exercise intensity.  She has been on levothyroxine as her only medication.  Review of laboratory over the past year has suggested that her thyroid has been over suppressed with a TSH less than 0.01 and elevated free T4 at 1.81 and 1.75.  On January 21 she underwent repeat laboratory and TSH continued to be slightly over suppressed at 0.03 with a normal T4 at 1.16.  She has not yet heard from her primary physician regarding the results.  Of note, despite her increased exercise lipid studies were significantly increased from 1 year ago with a total cholesterol now at 208, triglycerides increasing from 83-159, and LDL cholesterol increasing from 86-1 03.  HDL was slightly increased most likely  resulting from her increased exercise.  When shown the laboratory today, she feels that the increased triglycerides and glucose at 102 were most likely due to her daily glass of wine.  She denies any chest tightness or chest pressure.  She is unaware of any wheezing or asthma.  She is unaware of any palpitations, and denies any presyncope, syncope, PND or orthopnea.  She is sleeping well.  There is a family history for heart disease in her father and brother.  Her father died at age 12 with lung cancer and her brother currently 65 had CABG revascularization surgery at age 3.  She presents for evaluation.   Past Medical History:  Diagnosis Date   Gallstones    GERD (gastroesophageal reflux disease)    hx of   Hypothyroidism     Past Surgical History:  Procedure Laterality Date   CHOLECYSTECTOMY     COLONOSCOPY WITH PROPOFOL N/A 08/27/2017   Procedure: COLONOSCOPY WITH PROPOFOL;  Surgeon: Ileana Roup, MD;  Location: [facility];  Service: General;  Laterality: N/A;   SPIGELIAN HERNIA  [id number]   UMBILICAL HERNIA REPAIR N/A 08/29/2017   Procedure: DIAGNOSTIC LAPAROSCOPY PRIMARY REPAIR OF LEFT SPIGELIAN HERNIA  AND PRIMARY REPAIR OF UMBILICAL HERNIA;  Surgeon: Armandina Gemma, MD;  Location: [facility];  Service: General;  Laterality: N/A;   UMBILICAL HERNIA REPAIR      Current Medications: Outpatient Medications Prior to Visit  Medication Sig Dispense Refill   aspirin EC 81 MG tablet Take 1 tablet (  81 mg total) by mouth daily.     levothyroxine (SYNTHROID) 125 MCG tablet Take 1 tablet (125 mcg total) by mouth daily. 90 tablet 3   naproxen sodium (ALEVE) 220 MG tablet Take 1 tablet (220 mg total) by mouth daily as needed (pain).     albuterol (VENTOLIN HFA) 108 (90 Base) MCG/ACT inhaler Inhale 2 puffs into the lungs every 6 (six) hours as needed for wheezing or shortness of breath. 6.7 g 0   No facility-administered medications prior to visit.     Allergies:   Patient has no  known allergies.   Social History   Socioeconomic History   Marital status: Divorced    Spouse name: Not on file   Number of children: 2   Years of education: Bachelors degree   Highest education level: Not on file  Occupational History   Occupation: Dance movement psychotherapist  Tobacco Use   Smoking status: Former Smoker    Packs/Harrow: 0.25    Years: 10.00    Pack years: 2.50    Types: Cigarettes    Quit date: 08/22/1985    Years since quitting: 34.1   Smokeless tobacco: Never Used  Substance and Sexual Activity   Alcohol use: Yes    Comment: 1 glass of wine per week   Drug use: No   Sexual activity: Not Currently  Other Topics Concern   Not on file  Social History Narrative   09/16/19   From: [place] originally, but lived in [place] for 25 years   Living: alone   Work: Human resources officer for [employer]: 2 children - Journalist, newspaper (Family Medicine Resident, in [place]) and [place] - has 2 children (in [place])      Enjoys: exercise at [employer], visit children, traveling      Exercise: 3-4 times a week   Diet: could be better, overall pretty good, sweet tooth      Safety   Seat belts: Yes    Guns: No   Safe in relationships: Yes    Social Determinants of Radio broadcast assistant Strain:    Difficulty of Paying Living Expenses: Not on file  Food Insecurity:    Worried About Charity fundraiser in the Last Year: Not on file   [employer] of Food in the Last Year: Not on file  Transportation Needs:    Lack of Transportation (Medical): Not on file   Lack of Transportation (Non-Medical): Not on file  Physical Activity:    Days of Exercise per Week: Not on file   Minutes of Exercise per Session: Not on file  Stress:    Feeling of Stress : Not on file  Social Connections:    Frequency of Communication with Friends and Family: Not on file   Frequency of Social Gatherings with Friends and Family: Not on file   Attends Religious Services: Not on file     Active Member of Clubs or Organizations: Not on file   Attends Archivist Meetings: Not on file   Marital Status: Not on file    Socially, she was born in [place].  She attended [employer] in [place] and has a bachelor's degree in Careers information officer.  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  She was married for 57 years and in November 2020 was recently divorced after being separated for 5 years.  She has 2 children, son age 32 and a  daughter age 37.  She smoked 1/4 pack of cigarettes for 10 years but quit smoking in 1986.  Family History:  The patient's family history includes Heart attack in her maternal aunt; Heart disease in her brother; Lung cancer in her father; Other in her mother; Squamous cell carcinoma in her mother; Stroke in her paternal grandfather.   Her father died of lung cancer but had heart disease.  Her mother died with metabolic acidosis and had a history of MI.  Her older brother is status post CABG revascularization.  Her younger brother is stable.  ROS General: Negative; No fevers, chills, or night sweats;  HEENT: Negative; No changes in vision or hearing, sinus congestion, difficulty swallowing Pulmonary: Negative; No cough, wheezing, shortness of breath, hemoptysis Cardiovascular: See HPI GI: Negative; No nausea, vomiting, diarrhea, or abdominal pain GU: Negative; No dysuria, hematuria, or difficulty voiding Musculoskeletal: Negative; no myalgias, joint pain, or weakness Hematologic/Oncology: Negative; no easy bruising, bleeding Endocrine: Positive for hypothyroidism Neuro: Negative; no changes in balance, headaches Skin: Negative; No rashes or skin lesions Psychiatric: Negative; No behavioral problems, depression Sleep: Negative; No snoring, daytime sleepiness, hypersomnolence, bruxism, restless legs, hypnogognic hallucinations, no cataplexy Other comprehensive 14 point system review is  negative.   PHYSICAL EXAM:   VS:  BP 132/88    Pulse 76    Ht 5' 3.5" (1.613 m)    Wt 164 lb (74.4 kg)    BMI 28.60 kg/m     Repeat blood pressure by me 130/80  Wt Readings from Last 3 Encounters:  09/20/19 164 lb (74.4 kg)  09/16/19 164 lb 8 oz (74.6 kg)  08/21/18 160 lb 9.6 oz (72.8 kg)    General: Alert, oriented, no distress.  Skin: normal turgor, no rashes, warm and dry HEENT: Normocephalic, atraumatic. Pupils equal round and reactive to light; sclera anicteric; extraocular muscles intact;  Nose without nasal septal hypertrophy Mouth/Parynx benign; Mallinpatti scale 3 Neck: No JVD, no carotid bruits; normal carotid upstroke Lungs: clear to ausculatation and percussion; no wheezing or rales Chest wall: without tenderness to palpitation Heart: PMI not displaced, RRR, s1 s2 normal, 1/6 systolic murmur, no diastolic murmur, no rubs, gallops, thrills, or heaves Abdomen: soft, nontender; no hepatosplenomehaly, BS+; abdominal aorta nontender and not dilated by palpation. Back: no CVA tenderness Pulses 2+ Musculoskeletal: full range of motion, normal strength, no joint deformities Extremities: no clubbing cyanosis or edema, Homan's sign negative  Neurologic: grossly nonfocal; Cranial nerves grossly wnl Psychologic: Normal mood and affect   Studies/Labs Reviewed:   EKG:  EKG is ordered today.  ECG (independently read by me): Normal sinus rhythm at 76 bpm.  Possible left atrial enlargement.  No ectopy.  No ST segment changes.  Recent Labs: BMP Latest Ref Rng & Units 09/16/2019 08/21/2018 07/06/2018  Glucose 70 - 99 mg/dL 132(H) 99 100(H)  BUN 6 - 23 mg/dL 20 17 20   Creatinine 0.40 - 1.20 mg/dL 0.82 0.78 0.87  Sodium 135 - 145 mEq/L 140 142 140  Potassium 3.5 - 5.1 mEq/L 4.3 4.2 4.0  Chloride 96 - 112 mEq/L 104 105 104  CO2 19 - 32 mEq/L 30 28 28   Calcium 8.4 - 10.5 mg/dL 9.0 9.5 9.1     Hepatic Function Latest Ref Rng & Units 09/16/2019 08/21/2018 08/28/2017  Total Protein  6.0 - 8.3 g/dL 6.4 6.9 7.1  Albumin 3.5 - 5.2 g/dL 4.2 4.3 3.9  AST 0 - 37 U/L 18 20 38  ALT 0 - 35 U/L 18 19 20  Alk Phosphatase 39 - 117 U/L 70 56 53  Total Bilirubin 0.2 - 1.2 mg/dL 0.5 0.5 1.1    CBC Latest Ref Rng & Units 09/16/2019 11/19/2018 08/21/2018  WBC 4.0 - 10.5 K/uL 7.3 5.5 6.4  Hemoglobin 12.0 - 15.0 g/dL 13.8 13.8 13.6  Hematocrit 36.0 - 46.0 % 41.6 40.7 40.5  Platelets 150.0 - 400.0 K/uL 193.0 169.0 193.0   Lab Results  Component Value Date   MCV 89.0 09/16/2019   MCV 84.7 11/19/2018   MCV 86.6 08/21/2018   Lab Results  Component Value Date   TSH 0.03 (L) 09/16/2019   Lab Results  Component Value Date   HGBA1C 5.9 08/21/2018     BNP No results found for: BNP  ProBNP No results found for: PROBNP   Lipid Panel     Component Value Date/Time   CHOL 208 (H) 09/16/2019 0905   TRIG 159.0 (H) 09/16/2019 0905   HDL 73.20 09/16/2019 0905   CHOLHDL 3 09/16/2019 0905   VLDL 31.8 09/16/2019 0905   LDLCALC 103 (H) 09/16/2019 0905     RADIOLOGY: No results found.   Additional studies/ records that were reviewed today include:  I reviewed the records of Dr. Waunita Schooner and recent laboratory.    ASSESSMENT:    1. Dyspnea on exertion   2. Acquired hypothyroidism   3. Mild hyperlipidemia   4. Family history of heart disease   5. Medication management   6. Elevated glucose     PLAN:  Victoria Randall is a very pleasant 64 year old female who has a longstanding history of hypothyroidism and has been on levothyroxine replacement therapy.  It appears that over the past year she has been on excessive thyroid replacement and has had over suppressed TSH levels.  Her most recent free T4 level was normal and TSH was 0.03.  Over the past year she has been trying to get in shape and has been exercising at least 3 days/week and doing a boot camp.  Initially, she felt she was short of breath since she was significantly out of shape.  However despite working out over  the past year she has not noticed any major improvement in her exertional dyspnea.  She denies any awareness of hypertension and her blood pressure today is upper normal to borderline increased based on new hypertensive guidelines.  I am recommending she undergo a 2D echo Doppler study to assess both systolic and diastolic function as well as valvular architecture.  She denies any exertional chest tightness or chest pressure.  With her significant family history of CAD, I am recommending a screening cardiac calcium score for risk stratification.  Her most recent laboratory was reviewed and lipid studies are increased compared to 1 year ago with an increase in triglycerides as well as LDL.  In addition fasting glucose was increased at 132.  She will adjust her diet.  She will continue to exercise.  She denies any associated wheezing and a trial of an inhaler was suggested by Dr. Einar Pheasant which she deferred.  I will see her back in the office in approximately 3 months for reevaluation and follow-up of her above studies.  At that time I have recommended a follow-up chemistry profile lipid studies and hemoglobin A1c be obtained.  She will be adjusting her diet.  I will see her in 3 months for reevaluation or sooner depending upon results of her noninvasive testing.   Medication Adjustments/Labs and Tests Ordered: Current medicines are reviewed at length  with the patient today.  Concerns regarding medicines are outlined above.  Medication changes, Labs and Tests ordered today are listed in the Patient Instructions below. Patient Instructions  Medication Instructions:  Continue current medications  *If you need a refill on your cardiac medications before your next appointment, please call your pharmacy*  Lab Work: Fasting Lipid and CMP in 3 Months  If you have labs (blood work) drawn today and your tests are completely normal, you will receive your results only by:  [place] (if you have MyChart)  OR  A paper copy in the mail If you have any lab test that is abnormal or we need to change your treatment, we will call you to review the results.  Testing/Procedures: Your physician has requested that you have an echocardiogram. Echocardiography is a painless test that uses sound waves to create images of your heart. It provides your doctor with information about the size and shape of your heart and how well your hearts chambers and valves are working. This procedure takes approximately one hour. There are no restrictions for this procedure.  Your physician has requested that you have a Coronary Calcium Score. This test is done at our [facility].  Follow-Up: At [facility], you and your health needs are our priority.  As part of our continuing mission to provide you with exceptional heart care, we have created designated Provider Care Teams.  These Care Teams include your primary Cardiologist (physician) and Advanced Practice Providers (APPs -  Physician Assistants and Nurse Practitioners) who all work together to provide you with the care you need, when you need it.  Your next appointment:   3 month(s)  The format for your next appointment:   In Person  Provider:   Shelva Majestic, MD       Signed, Shelva Majestic, MD  09/20/2019 6:26 PM    [facility] [address], [place], [place], [place]  [address] Phone: [phone]

## 2019-10-12 ENCOUNTER — Other Ambulatory Visit: Payer: Self-pay

## 2019-10-12 ENCOUNTER — Ambulatory Visit (INDEPENDENT_AMBULATORY_CARE_PROVIDER_SITE_OTHER)
Admission: RE | Admit: 2019-10-12 | Discharge: 2019-10-12 | Disposition: A | Discharge status: Home / Self Care | Payer: Self-pay | Source: Ambulatory Visit | Attending: Cardiovascular Disease | Admitting: Cardiovascular Disease

## 2019-10-12 ENCOUNTER — Ambulatory Visit (HOSPITAL_COMMUNITY): Payer: Managed Care, Other (non HMO) | Attending: Cardiovascular Disease

## 2019-10-12 DIAGNOSIS — R0600 Dyspnea, unspecified: Secondary | ICD-10-CM | POA: Insufficient documentation

## 2019-10-12 DIAGNOSIS — R0609 Other forms of dyspnea: Secondary | ICD-10-CM

## 2019-12-07 ENCOUNTER — Other Ambulatory Visit: Payer: Self-pay | Admitting: Family

## 2019-12-07 DIAGNOSIS — E039 Hypothyroidism, unspecified: Secondary | ICD-10-CM

## 2019-12-09 NOTE — Telephone Encounter (Signed)
Please clarify what medication patient is needing.   Also remind her that she is due for repeat thyroid labs.

## 2019-12-09 NOTE — Telephone Encounter (Signed)
Patient called about refill request She stated the pharmacy advised her to call our office because the refill was denied. Patient stated that her dosage was being changed and thought maybe that was the reason  Patient is requesting a call back

## 2019-12-09 NOTE — Telephone Encounter (Signed)
Spoke with patient to let her know that pharmacy was faxing request for Levothyroxine 150 mg but she is on 125 mg now and that is why request was denied. I called [facility] and updated their record with current dose and they will get this ready for pick up for the patient.  Also patient is going to have labs done and cardiology visit on 12/13/19-they are in epic- and she will ask them to draw TSH and A1C that was ordered by Dr Selena Batten so she does not have to be stuck 2 times. Patient will let us know if they can not do this and will schedule it here at that time.   FYI to Dr Selena Batten

## 2019-12-13 ENCOUNTER — Other Ambulatory Visit: Payer: Self-pay

## 2019-12-13 ENCOUNTER — Encounter: Payer: Self-pay | Admitting: Cardiovascular Disease

## 2019-12-13 ENCOUNTER — Ambulatory Visit (INDEPENDENT_AMBULATORY_CARE_PROVIDER_SITE_OTHER): Payer: Managed Care, Other (non HMO) | Admitting: Cardiovascular Disease

## 2019-12-13 DIAGNOSIS — E039 Hypothyroidism, unspecified: Secondary | ICD-10-CM | POA: Diagnosis not present

## 2019-12-13 DIAGNOSIS — R0609 Other forms of dyspnea: Secondary | ICD-10-CM

## 2019-12-13 DIAGNOSIS — E785 Hyperlipidemia, unspecified: Secondary | ICD-10-CM | POA: Diagnosis not present

## 2019-12-13 DIAGNOSIS — Z79899 Other long term (current) drug therapy: Secondary | ICD-10-CM | POA: Diagnosis not present

## 2019-12-13 DIAGNOSIS — R0600 Dyspnea, unspecified: Secondary | ICD-10-CM

## 2019-12-13 LAB — COMPREHENSIVE METABOLIC PANEL
ALT: 18 IU/L (ref 0–32)
AST: 28 IU/L (ref 0–40)
Albumin/Globulin Ratio: 2.4 — ABNORMAL HIGH (ref 1.2–2.2)
Albumin: 4.5 g/dL (ref 3.8–4.8)
Alkaline Phosphatase: 76 IU/L (ref 39–117)
BUN/Creatinine Ratio: 23 (ref 12–28)
BUN: 17 mg/dL (ref 8–27)
Bilirubin Total: 0.7 mg/dL (ref 0.0–1.2)
CO2: 24 mmol/L (ref 20–29)
Calcium: 9.1 mg/dL (ref 8.7–10.3)
Chloride: 104 mmol/L (ref 96–106)
Creatinine, Ser: 0.73 mg/dL (ref 0.57–1.00)
GFR calc Af Amer: 100 mL/min/{1.73_m2} (ref >59)
GFR calc non Af Amer: 87 mL/min/{1.73_m2} (ref >59)
Globulin, Total: 1.9 g/dL (ref 1.5–4.5)
Glucose: 98 mg/dL (ref 65–99)
Potassium: 4.6 mmol/L (ref 3.5–5.2)
Sodium: 143 mmol/L (ref 134–144)
Total Protein: 6.4 g/dL (ref 6.0–8.5)

## 2019-12-13 LAB — LIPID PANEL
Chol/HDL Ratio: 2.5 ratio (ref 0.0–4.4)
Cholesterol, Total: 203 mg/dL — ABNORMAL HIGH (ref 100–199)
HDL: 81 mg/dL (ref >39)
LDL Chol Calc (NIH): 108 mg/dL — ABNORMAL HIGH (ref 0–99)
Triglycerides: 78 mg/dL (ref 0–149)
VLDL Cholesterol Cal: 14 mg/dL (ref 5–40)

## 2019-12-13 NOTE — Progress Notes (Signed)
Cardiology Office Note    Date:  12/19/2019   ID:  Cindee Mclester Haupert, DOB 15-Jul-1956, MRN [id number]  PCP:  Lesleigh Noe, MD  Cardiologist:  Shelva Majestic, MD      F/U cardiology evaluation initially referred through the courtesy of Dr. Waunita Schooner for evaluation of shortness of breath with activity.  History of Present Illness:  Victoria Randall is a 64 y.o. female who has a history of hypothyroidism for 15 to 20 years and has been on thyroid replacement medication.  She was referred through the courtesy of Dr. Waunita Schooner for evaluation of exertional shortness of breath.  I saw her for initial evaluation with me on September 20, 2019.  She presents for 26-monthfollow-up evaluation.  Victoria Randall denies any awareness of cardiac disease.  In January 2020 she changed her lifestyle and began to exercise.  Over the past year, she has been participating in boot camp 3 days/week.  Even during the COVID-19 pandemic she has been attending outdoor classes for 45 minutes.  When she initially started working out she noted significant exertional dyspnea.  However, despite working out for a year she believes she has not noticed any significant improvement in her exertional shortness of breath.  She was recently evaluated by JWaunita Schooner  She denied any associated wheezing.  She feels drained and exhausted at times has difficulty catching her breath with the exercise intensity.  She has been on levothyroxine as her only medication.  Review of laboratory over the past year has suggested that her thyroid has been over suppressed with a TSH less than 0.01 and elevated free T4 at 1.81 and 1.75.  On January 21 she underwent repeat laboratory and TSH continued to be slightly over suppressed at 0.03 with a normal T4 at 1.16.  She has not yet heard from her primary physician regarding the results.  Of note, despite her increased exercise lipid studies were significantly increased from 1 year ago with a total cholesterol now at 208,  triglycerides increasing from 83-159, and LDL cholesterol increasing from 86 to 103.  HDL was slightly increased most likely resulting from her increased exercise.  When shown the laboratory today, she feels that the increased triglycerides and glucose at 102 were most likely due to her daily glass of wine.  She denies any chest tightness or chest pressure.  She is unaware of any wheezing or asthma.  She is unaware of any palpitations, and denies any presyncope, syncope, PND or orthopnea.  She is sleeping well.  There is a family history for heart disease in her father and brother.  Her father died at age 3145with lung cancer and her brother currently 652had CABG revascularization surgery at age 66    Since her initial evaluation, she underwent a 2D echo Doppler study on October 12, 2019 revealed an EF of 60 to 65%; EF by 3D volume 64%.  Left ventricular diastolic parameters were consistent with age-related leg relaxation.  Letter strong family history for CAD I had recommended she undergo cardiac screening with a CT cardiac scoring study.  Coronary calcification score was 0.  The ascending aorta measured 2.8 cm.  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  Presently she feels well.  Due to her thyroid over suppression her levothyroxine dose has recently been reduced from 175 mcg down to 150 mcg and more recently down to 125 mcg daily.  She denies any chest  pain.  She presents for reevaluation.   Past Medical History:  Diagnosis Date  . Gallstones   . GERD (gastroesophageal reflux disease)    hx of  . Hypothyroidism     Past Surgical History:  Procedure Laterality Date  . CHOLECYSTECTOMY    . COLONOSCOPY WITH PROPOFOL N/A 08/27/2017   Procedure: COLONOSCOPY WITH PROPOFOL;  Surgeon: Ileana Roup, MD;  Location: [facility];  Service: General;  Laterality: N/A;  . SPIGELIAN HERNIA  08/2017  .  UMBILICAL HERNIA REPAIR N/A 08/29/2017   Procedure: DIAGNOSTIC LAPAROSCOPY PRIMARY REPAIR OF LEFT SPIGELIAN HERNIA  AND PRIMARY REPAIR OF UMBILICAL HERNIA;  Surgeon: Armandina Gemma, MD;  Location: [facility];  Service: General;  Laterality: N/A;  . UMBILICAL HERNIA REPAIR      Current Medications: Outpatient Medications Prior to Visit  Medication Sig Dispense Refill  . aspirin EC 81 MG tablet Take 1 tablet (81 mg total) by mouth daily.    Marland Kitchen levothyroxine (SYNTHROID) 125 MCG tablet Take 1 tablet (125 mcg total) by mouth daily. 90 tablet 3  . naproxen sodium (ALEVE) 220 MG tablet Take 1 tablet (220 mg total) by mouth daily as needed (pain).     No facility-administered medications prior to visit.     Allergies:   Patient has no known allergies.   Social History   Socioeconomic History  . Marital status: Divorced    Spouse name: Not on file  . Number of children: 2  . Years of education: Bachelors degree  . Highest education level: Not on file  Occupational History  . Occupation: Dance movement psychotherapist  Tobacco Use  . Smoking status: Former Smoker    Packs/Bassett: 0.25    Years: 10.00    Pack years: 2.50    Types: Cigarettes    Quit date: 08/22/1985    Years since quitting: 34.3  . Smokeless tobacco: Never Used  Substance and Sexual Activity  . Alcohol use: Yes    Comment: 1 glass of wine per week  . Drug use: No  . Sexual activity: Not Currently  Other Topics Concern  . Not on file  Social History Narrative   09/16/19   From: [place] originally, but lived in [place] for 25 years   Living: alone   Work: Human resources officer for [employer]: 2 children - Journalist, newspaper (Family Medicine Resident, in [place]) and [place] - has 2 children (in [place])      Enjoys: exercise at [employer], visit children, traveling      Exercise: 3-4 times a week   Diet: could be better, overall pretty good, sweet tooth      Safety   Seat belts: Yes    Guns: No   Safe in relationships: Yes    Social  Determinants of Radio broadcast assistant Strain:   . Difficulty of Paying Living Expenses:   Food Insecurity:   . Worried About Charity fundraiser in the Last Year:   . Arboriculturist in the Last Year:   Transportation Needs:   . Film/video editor (Medical):   Marland Kitchen Lack of Transportation (Non-Medical):   Physical Activity:   . Days of Exercise per Week:   . Minutes of Exercise per Session:   Stress:   . Feeling of Stress :   Social Connections:   . Frequency of Communication with Friends and Family:   . Frequency of Social Gatherings with Friends and Family:   .  Attends Religious Services:   . Active Member of Clubs or Organizations:   . Attends Archivist Meetings:   Marland Kitchen Marital Status:     Socially, she was born in [place].  She attended [employer] in [place] and has a bachelor's degree in Careers information officer.  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  She was married for 67 years and in November 2020 was recently divorced after being separated for 5 years.  She has 2 children, son age 51 and a daughter age 82.  She smoked 1/4 pack of cigarettes for 10 years but quit smoking in 1986.  Family History:  The patient's family history includes Heart attack in her maternal aunt; Heart disease in her brother; Lung cancer in her father; Other in her mother; Squamous cell carcinoma in her mother; Stroke in her paternal grandfather.   Her father died of lung cancer but had heart disease.  Her mother died with metabolic acidosis and had a history of MI.  Her older brother is status post CABG revascularization.  Her younger brother is stable.  ROS General: Negative; No fevers, chills, or night sweats;  HEENT: Negative; No changes in vision or hearing, sinus congestion, difficulty swallowing Pulmonary: Negative; No cough, wheezing, shortness of breath, hemoptysis Cardiovascular: See HPI GI: Negative; No nausea, vomiting,  diarrhea, or abdominal pain GU: Negative; No dysuria, hematuria, or difficulty voiding Musculoskeletal: Negative; no myalgias, joint pain, or weakness Hematologic/Oncology: Negative; no easy bruising, bleeding Endocrine: Positive for hypothyroidism Neuro: Negative; no changes in balance, headaches Skin: Negative; No rashes or skin lesions Psychiatric: Negative; No behavioral problems, depression Sleep: Negative; No snoring, daytime sleepiness, hypersomnolence, bruxism, restless legs, hypnogognic hallucinations, no cataplexy Other comprehensive 14 point system review is negative.   PHYSICAL EXAM:   VS:  BP 139/88   Pulse 71   Ht 5' 4"  (1.626 m)   Wt 165 lb 3.2 oz (74.9 kg)   SpO2 99%   BMI 28.36 kg/m     BP by me was initially 132/84 and on repeat 126/84  Wt Readings from Last 3 Encounters:  12/13/19 165 lb 3.2 oz (74.9 kg)  09/20/19 164 lb (74.4 kg)  09/16/19 164 lb 8 oz (74.6 kg)    General: Alert, oriented, no distress.  Skin: normal turgor, no rashes, warm and dry HEENT: Normocephalic, atraumatic. Pupils equal round and reactive to light; sclera anicteric; extraocular muscles intact;  Nose without nasal septal hypertrophy Mouth/Parynx benign; Mallinpatti scale 3 Neck: No JVD, no carotid bruits; normal carotid upstroke Lungs: clear to ausculatation and percussion; no wheezing or rales Chest wall: without tenderness to palpitation Heart: PMI not displaced, RRR, s1 s2 normal, 1/6 systolic murmur, no diastolic murmur, no rubs, gallops, thrills, or heaves Abdomen: soft, nontender; no hepatosplenomehaly, BS+; abdominal aorta nontender and not dilated by palpation. Back: no CVA tenderness Pulses 2+ Musculoskeletal: full range of motion, normal strength, no joint deformities Extremities: no clubbing cyanosis or edema, Homan's sign negative  Neurologic: grossly nonfocal; Cranial nerves grossly wnl Psychologic: Normal mood and affect   September 20, 2019 ECG (independently read  by me): Normal sinus rhythm at 76 bpm.  Possible left atrial enlargement.  No ectopy.  No ST segment changes.  Recent Labs: BMP Latest Ref Rng & Units 12/13/2019 09/16/2019 08/21/2018  Glucose 65 - 99 mg/dL 98 132(H) 99  BUN 8 - 27 mg/dL 17 20 17   Creatinine 0.57 - 1.00 mg/dL 0.73 0.82 0.78  BUN/Creat Ratio  12 - 28 23 - -  Sodium 134 - 144 mmol/L 143 140 142  Potassium 3.5 - 5.2 mmol/L 4.6 4.3 4.2  Chloride 96 - 106 mmol/L 104 104 105  CO2 20 - 29 mmol/L 24 30 28   Calcium 8.7 - 10.3 mg/dL 9.1 9.0 9.5     Hepatic Function Latest Ref Rng & Units 12/13/2019 09/16/2019 08/21/2018  Total Protein 6.0 - 8.5 g/dL 6.4 6.4 6.9  Albumin 3.8 - 4.8 g/dL 4.5 4.2 4.3  AST 0 - 40 IU/L 28 18 20   ALT 0 - 32 IU/L 18 18 19   Alk Phosphatase 39 - 117 IU/L 76 70 56  Total Bilirubin 0.0 - 1.2 mg/dL 0.7 0.5 0.5    CBC Latest Ref Rng & Units 09/16/2019 11/19/2018 08/21/2018  WBC 4.0 - 10.5 K/uL 7.3 5.5 6.4  Hemoglobin 12.0 - 15.0 g/dL 13.8 13.8 13.6  Hematocrit 36.0 - 46.0 % 41.6 40.7 40.5  Platelets 150.0 - 400.0 K/uL 193.0 169.0 193.0   Lab Results  Component Value Date   MCV 89.0 09/16/2019   MCV 84.7 11/19/2018   MCV 86.6 08/21/2018   Lab Results  Component Value Date   TSH 0.03 (L) 09/16/2019   Lab Results  Component Value Date   HGBA1C 5.9 08/21/2018     BNP No results found for: BNP  ProBNP No results found for: PROBNP   Lipid Panel     Component Value Date/Time   CHOL 203 (H) 12/13/2019 0847   TRIG 78 12/13/2019 0847   HDL 81 12/13/2019 0847   CHOLHDL 2.5 12/13/2019 0847   CHOLHDL 3 09/16/2019 0905   VLDL 31.8 09/16/2019 0905   LDLCALC 108 (H) 12/13/2019 0847   LABVLDL 14 12/13/2019 0847     RADIOLOGY: No results found.   Additional studies/ records that were reviewed today include:  I reviewed the records of Dr. Waunita Schooner and recent laboratory.  I personally reviewed the echo Doppler data and the CT cardiac scoring evaluation as noted above.  ASSESSMENT:     1. Dyspnea on exertion   2. Mild hyperlipidemia   3. Medication management   4. Acquired hypothyroidism     PLAN:  Victoria Randall is a very pleasant 64 year old female who has a longstanding history of hypothyroidism and has been on levothyroxine replacement therapy.  It appears that over the past year she has been on excessive thyroid replacement and has had over suppressed TSH levels.  When I initially saw her, her most recent free T4 level was normal and TSH was 0.03.  Subsequently, her levothyroxine dose has been reduced from 175 mcg to her current dose now at 125 mcg.  She has done a great job over the past year trying to get in shape and has continued to exercise regularly during the COVID-19 pandemic.  She had experienced exertional shortness of breath.  I reviewed her echo Doppler data stenosis with 64%.  She had mild age-related diastolic relaxation which may be contributing to some of her exertional dyspnea.  Valvular architecture was normal.  PA pressure estimate was normal.  Cardiac scoring is 0 argue against subclinical atherosclerosis.  Her blood pressure today is stable on repeat by me.  I reassured her that presently she appears cardiac stable.  LDL cholesterol in January 2021 was 103.  She will return to the care of Dr. Waunita Schooner.  I will be available to see her anytime in the future as needed.   Medication Adjustments/Labs and Tests Ordered:  Current medicines are reviewed at length with the patient today.  Concerns regarding medicines are outlined above.  Medication changes, Labs and Tests ordered today are listed in the Patient Instructions below. Patient Instructions  Medication Instructions:  CONTINUE WITH CURRENT MEDICATIONS. NO CHANGES.  *If you need a refill on your cardiac medications before your next appointment, please call your pharmacy*    Follow-Up: FOLLOW UP AS NEEDED WITH DR.Duard Spiewak     Signed, Shelva Majestic, MD  12/19/2019 9:57 AM    [facility] [address], [place], [place], [place]  [address] Phone: [phone]

## 2019-12-13 NOTE — Patient Instructions (Signed)
Medication Instructions:  CONTINUE WITH CURRENT MEDICATIONS. NO CHANGES.  *If you need a refill on your cardiac medications before your next appointment, please call your pharmacy*    Follow-Up: FOLLOW UP AS NEEDED WITH DR.KELLY 

## 2019-12-19 ENCOUNTER — Encounter: Payer: Self-pay | Admitting: Cardiovascular Disease

## 2019-12-23 ENCOUNTER — Telehealth: Payer: Self-pay

## 2019-12-23 NOTE — Telephone Encounter (Signed)
Pt goes to 2 strenuous classes on Sunday for exercise at [facility]; no known knee injury but on Mon 12/20/19 pt rt knee was swollen and hurting; last night the swelling worsened and pt said knee does not want to bend; pt does not want to go to UC or ED. Pt icing the rt knee with advil and pt is working from home so her activity is flexible. Pt scheduled appt with Dr Patsy Lager on 12/27/19 at 11 AM. UC & ED precautions given and pt voiced understanding.Pt has no covid symptoms, no travel and no known exposure to + covid.

## 2019-12-27 ENCOUNTER — Ambulatory Visit (INDEPENDENT_AMBULATORY_CARE_PROVIDER_SITE_OTHER): Payer: Managed Care, Other (non HMO) | Admitting: Family Medicine

## 2019-12-27 ENCOUNTER — Encounter: Payer: Self-pay | Admitting: Family Medicine

## 2019-12-27 ENCOUNTER — Other Ambulatory Visit: Payer: Self-pay

## 2019-12-27 ENCOUNTER — Ambulatory Visit (INDEPENDENT_AMBULATORY_CARE_PROVIDER_SITE_OTHER)
Admission: RE | Admit: 2019-12-27 | Discharge: 2019-12-27 | Disposition: A | Discharge status: Home / Self Care | Payer: Managed Care, Other (non HMO) | Source: Ambulatory Visit | Attending: Family Medicine | Admitting: Family Medicine

## 2019-12-27 VITALS — BP 120/80 | HR 76 | Temp 98.7°F | Ht 63.5 in | Wt 163.8 lb

## 2019-12-27 DIAGNOSIS — M1711 Unilateral primary osteoarthritis, right knee: Secondary | ICD-10-CM

## 2019-12-27 DIAGNOSIS — M25561 Pain in right knee: Secondary | ICD-10-CM

## 2019-12-27 NOTE — Patient Instructions (Signed)
Adaptations:  Flexion - this is the part that will likely hurt more than anything.  Lunges: can be difficult if not tracking. If they are not done with good form, then more difficult on the knee.  Deep wall sits or squats will aggravate the knee.  Don't go all the way to parallel.  Jumping and plyometrics.  Bounding and jumps, box jumps can be more difficult on the knee.  - jumping jacks and burpees.  - Steps ups can hurt knee.

## 2019-12-27 NOTE — Progress Notes (Signed)
Victoria Enck T. Aishwarya Shiplett, MD, CAQ Sports Medicine  Primary Care and Sports Medicine [facility] at [facility] [address] [place], [address]  Phone: [phone]  FAX: [phone]  Victoria Randall - 64 y.o. female  MRN [id number]  Date of Birth: 1956/07/27  Date: 12/27/2019  PCP: Lynnda Child, MD  Referral: Lynnda Child, MD  Chief Complaint  Patient presents with  . Knee Pain    Right    This visit occurred during the SARS-CoV-2 public health emergency.  Safety protocols were in place, including screening questions prior to the visit, additional usage of staff PPE, and extensive cleaning of exam room while observing appropriate contact time as indicated for disinfecting solutions.   Subjective:   Victoria Randall is a 64 y.o. very pleasant female patient with Body mass index is 28.55 kg/m. who presents with the following:  New consultation, R knee:  Ramped up her exervise patterns in the last year.  Two classes on Tuesday.   Had a large effusion.    She has gone from being relatively less active to now doing [employer] approximately 5 times per week.  She is doing some relatively intense exercise including lunges with cause her some knee pain.  She also is doing some jumping, pounding, high level of jumping jacks, Burpee's.  She is tried multiple over-the-counter medicines including NSAIDs, ice.  She has no significant history of knee injury or pain.  She is not had any acute injury, but over the weekend she did have a large effusion which caused her some pain with walking and going up and down stairs.  At this point she thinks she is about 90% better compared to where she was at the end of last week.  Review of Systems is noted in the HPI, as appropriate   Objective:   BP 120/80   Pulse 76   Temp 98.7 F (37.1 C) (Temporal)   Ht 5' 3.5" (1.613 m)   Wt 163 lb 12 oz (74.3 kg)   SpO2 97%   BMI 28.55 kg/m    GEN: No acute distress;  alert,appropriate. PULM: Breathing comfortably in no respiratory distress PSYCH: Normally interactive.   Knee: Right Gait: Normal heel toe pattern ROM: 0-1 25 Effusion: Mild Echymosis or edema: none Patellar tendon NT Painful PLICA: neg Patellar grind: Mild Medial and lateral patellar facet loading: negative medial and lateral joint lines: Mild posterior medial Mcmurray's neg Flexion-pinch neg Varus and valgus stress: stable Lachman: neg Ant and Post drawer: neg Hip abduction, IR, ER: WNL Hip flexion str: 5/5 Hip abd: 5/5 Quad: 5/5 VMO atrophy:No Hamstring concentric and eccentric: 5/5   Radiology: DG Knee 4 Views W/Patella Right  Result Date: 12/27/2019 CLINICAL DATA:  Right knee pain. EXAM: RIGHT KNEE - COMPLETE 4+ VIEW COMPARISON:  None. FINDINGS: No fracture or bone lesion. Minor marginal osteophytes from all 3 compartments. Mild medial joint space compartment narrowing. No other degenerative/arthropathic changes. No joint effusion. Soft tissues are unremarkable. IMPRESSION: 1. Mild osteoarthritis.  No fracture or acute finding. Electronically Signed   By: Amie Portland M.D.   On: 12/27/2019 16:22     Assessment and Plan:     ICD-10-CM   1. Acute pain of right knee  M25.561 DG Knee 4 Views W/Patella Right  2. Primary osteoarthritis of right knee  M17.11    Total encounter time: 30 minutes. On the [place] of the patient encounter, this can include review of prior records,  labs, and imaging.  Additional time can include counselling, consultation with peer MD in person or by telephone.  This also includes independent review of Radiology.  Mild joint space narrowing on the medial compartment alone in the right side.  Early osteophytosis. I agree with radiological interpretation  Basically, I think that she overdid it at her boot camp when she was doing 100 jumping jacks back to back.  At this point she was doing quite a bit better.  I think that she is overtraining somewhat and  this is leading to some knee pain.  We talked about some strategies to decrease the stress in her knees including altering range of motion as well as decreasing some of the bounding.  Patient Instructions  Adaptations:  Flexion - this is the part that will likely hurt more than anything.  Lunges: can be difficult if not tracking. If they are not done with good form, then more difficult on the knee.  Deep wall sits or squats will aggravate the knee.  Don't go all the way to parallel.  Jumping and plyometrics.  Bounding and jumps, box jumps can be more difficult on the knee.  - jumping jacks and burpees.  - Steps ups can hurt knee.    I appreciate the opportunity to evaluate this very friendly patient. If you have any question regarding her care or prognosis, do not hesitate to ask.   Follow-up: No follow-ups on file.  Orders Placed This Encounter  Procedures  . DG Knee 4 Views W/Patella Right    Signed,  Frederico Hamman T. Akya Fiorello, MD   Outpatient Encounter Medications as of 12/27/2019  Medication Sig  . aspirin EC 81 MG tablet Take 1 tablet (81 mg total) by mouth daily.  Marland Kitchen levothyroxine (SYNTHROID) 125 MCG tablet Take 1 tablet (125 mcg total) by mouth daily.  . naproxen sodium (ALEVE) 220 MG tablet Take 1 tablet (220 mg total) by mouth daily as needed (pain).   No facility-administered encounter medications on file as of 12/27/2019.

## 2020-01-25 ENCOUNTER — Telehealth: Payer: Self-pay

## 2020-01-25 NOTE — Telephone Encounter (Signed)
Pt left v/m that pt was seen by Dr Patsy Lager 12/27/19 and pt is still having swelling in rt knee to the point the knee feels tight. Pt has tried OTC and resting knee but swelling is no better and pt wants to know if there is further testing like MRI that could be done or does pt need referral. Pt request cb.

## 2020-01-26 NOTE — Telephone Encounter (Signed)
Mrs. Kochanowski notified as instructed by telephone.  Appointment scheduled with Dr. Patsy Lager for 01/31/2020 at 10:20 am to re-evaluate right knee.

## 2020-01-26 NOTE — Telephone Encounter (Signed)
Please call:  I have a large referral nonoperative sports medicine practice.  This is almost entirely what I do at this point.  If she would like to have another opinion, then that is entirely reasonable.  Typically, I would reevaluate someone in the office when they are having some ongoing symptoms and with her, she has not tried much aside from basic care.

## 2020-01-30 NOTE — Progress Notes (Signed)
Michela Herst T. Hance Caspers, MD, [place] [facility] [place], [address]  Phone: [phone]  FAX: [phone]  Ambermarie Honeyman Boyack - 64 y.o. female  MRN [id number]  Date of Birth: June 16, 1956  Date: 01/31/2020  PCP: Lesleigh Noe, MD  Referral: Lesleigh Noe, MD  Chief Complaint  Patient presents with  . Follow-up    Right Knee    This visit occurred during the SARS-CoV-2 public health emergency.  Safety protocols were in place, including screening questions prior to the visit, additional usage of staff PPE, and extensive cleaning of exam room while observing appropriate contact time as indicated for disinfecting solutions.   Subjective:   Victoria Randall is a 64 y.o. very pleasant female patient with Body mass index is 28.86 kg/m. who presents with the following:  Follow-up right knee pain. On my initial evaluation on Dec 27, 2019 the patient was having some mild knee pain that was approaching recovery. She had some questions about her exercise program, so made some suggestions to decrease some load on her knee.  Walking now, has some swelling.  Knees do not have pain and worried about her knee and think s that she may make it worse.  Decreased range of motion.  Will have some mechanical sx.  On examination of discussion today, the patient is not really having any knee pain, nor does she have Navedo-to-Elbert knee pain.  She has having some tightness and some stiffness after working out.  She is icing her knee and taking some general anti-inflammatories.  She has had some occasional catching and popping.  No significant pain unless she is having some deep flexion.  Also some ballistic movements in the gym tend to bother her.  12/27/2019 Last OV with Owens Loffler, MD  New consultation, R knee:   Ramped up her exervise patterns in the last year.  Two classes on Tuesday.   Had a large effusion.       She has gone from being relatively less active to now doing [employer] approximately 5 times per week.  She is doing some relatively intense exercise including lunges with cause her some knee pain.  She also is doing some jumping, pounding, high level of jumping jacks, Burpee's.   She is tried multiple over-the-counter medicines including NSAIDs, ice.   She has no significant history of knee injury or pain.  She is not had any acute injury, but over the weekend she did have a large effusion which caused her some pain with walking and going up and down stairs.   At this point she thinks she is about 90% better compared to where she was at the end of last week.   Review of Systems is noted in the HPI, as appropriate  Objective:   BP 120/84   Pulse 76   Temp 98.8 F (37.1 C) (Temporal)   Ht 5' 3.5" (1.613 m)   Wt 165 lb 8 oz (75.1 kg)   SpO2 96%   BMI 28.86 kg/m   GEN: No acute distress; alert,appropriate. PULM: Breathing comfortably in no respiratory distress PSYCH: Normally interactive.   Right knee: Mild effusion.  No pain with loading the patellar facets.  Medial and lateral joint lines are not particularly tender to palpation.  Patellar tendon and quad tendon are nontender.  Anserine bursa is nontender.  ACL, PCL, LCL, and MCL are nontender.  With deep  flexion the patient does have some pain.  There are no mechanical symptoms with McMurray's, but she does have some modest pain with exam.  Bounce home testing is negative.  Laboratory and Imaging Data: DG Knee 4 Views W/Patella Right  Result Date: 12/27/2019 CLINICAL DATA:  Right knee pain. EXAM: RIGHT KNEE - COMPLETE 4+ VIEW COMPARISON:  None. FINDINGS: No fracture or bone lesion. Minor marginal osteophytes from all 3 compartments. Mild medial joint space compartment narrowing. No other degenerative/arthropathic changes. No joint effusion. Soft tissues are unremarkable. IMPRESSION: 1. Mild osteoarthritis.  No fracture or acute  finding. Electronically Signed   By: Amie Portland M.D.   On: 12/27/2019 16:22   Assessment and Plan:     ICD-10-CM   1. Acute pain of right knee  M25.561   2. Primary osteoarthritis of right knee  M17.11    Her history and exam are fairly benign today.  I did my best to reassure her, and encouraged her to continue to work out as tolerated based on pain.  Work on weight loss, quadricep strengthening, hip stability as well as core.  I reassured her that having some stiffness and discomfort after working out is normal, particularly at age 30 with some mild arthritis.  I do not think there needs to be any additional intervention at this time.  If you want to jog walk: Patient Instructions  Knute Neu to 5 k running program - delay the process       Follow-up: prn only  No orders of the defined types were placed in this encounter.  There are no discontinued medications. No orders of the defined types were placed in this encounter.   Signed,  Elpidio Galea. Makaylah Oddo, MD   Outpatient Encounter Medications as of 01/31/2020  Medication Sig  . aspirin EC 81 MG tablet Take 1 tablet (81 mg total) by mouth daily.  Marland Kitchen levothyroxine (SYNTHROID) 125 MCG tablet Take 1 tablet (125 mcg total) by mouth daily.  . naproxen sodium (ALEVE) 220 MG tablet Take 1 tablet (220 mg total) by mouth daily as needed (pain).   No facility-administered encounter medications on file as of 01/31/2020.

## 2020-01-31 ENCOUNTER — Ambulatory Visit (INDEPENDENT_AMBULATORY_CARE_PROVIDER_SITE_OTHER): Payer: Managed Care, Other (non HMO) | Admitting: Family Medicine

## 2020-01-31 ENCOUNTER — Encounter: Payer: Self-pay | Admitting: Family Medicine

## 2020-01-31 ENCOUNTER — Other Ambulatory Visit: Payer: Self-pay

## 2020-01-31 VITALS — BP 120/84 | HR 76 | Temp 98.8°F | Ht 63.5 in | Wt 165.5 lb

## 2020-01-31 DIAGNOSIS — M25561 Pain in right knee: Secondary | ICD-10-CM | POA: Diagnosis not present

## 2020-01-31 DIAGNOSIS — M1711 Unilateral primary osteoarthritis, right knee: Secondary | ICD-10-CM

## 2020-01-31 NOTE — Patient Instructions (Signed)
Couch to 5 k running program - delay the process

## 2020-02-04 ENCOUNTER — Telehealth: Payer: Self-pay

## 2020-02-04 NOTE — Telephone Encounter (Signed)
Pt said she did not get cb if her rt knee xray disk was ready for pick up. I checked and the disc is at front desk and ready for pick up.. Pt voiced understanding and will come by today to pick up. Nothing further needed.

## 2020-05-14 IMAGING — MG DIGITAL SCREENING BILATERAL MAMMOGRAM WITH TOMO AND CAD
8 series · 9 of 24 positions shown · non-contrast
Comparison: Previous exam(s).

CLINICAL DATA: Screening.

EXAM:
DIGITAL SCREENING BILATERAL MAMMOGRAM WITH TOMO AND CAD

[L CC synth-2D]
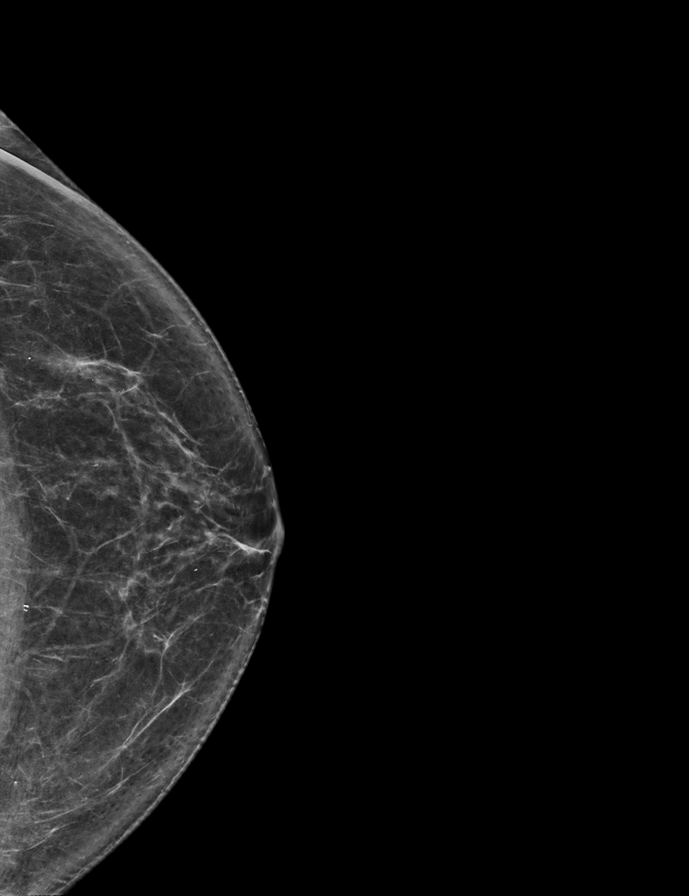

[R MLO synth-2D]
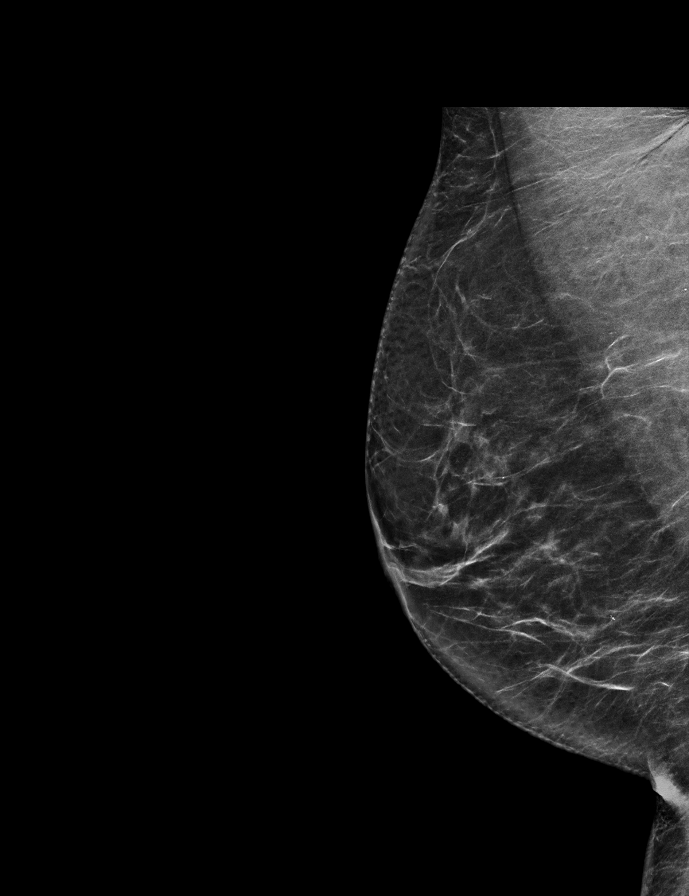

[L MLO synth-2D]
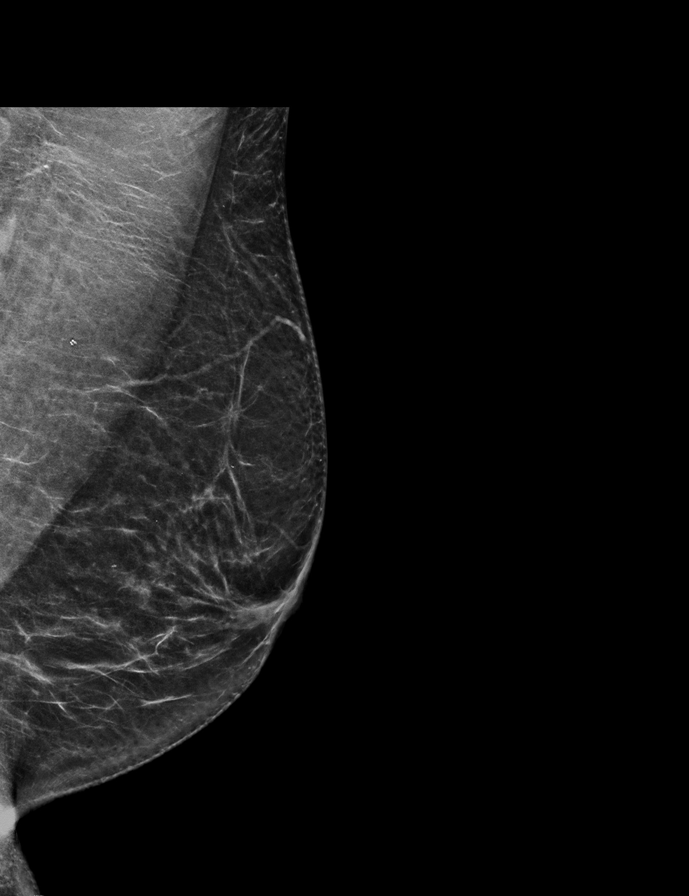

[R CC synth-2D]
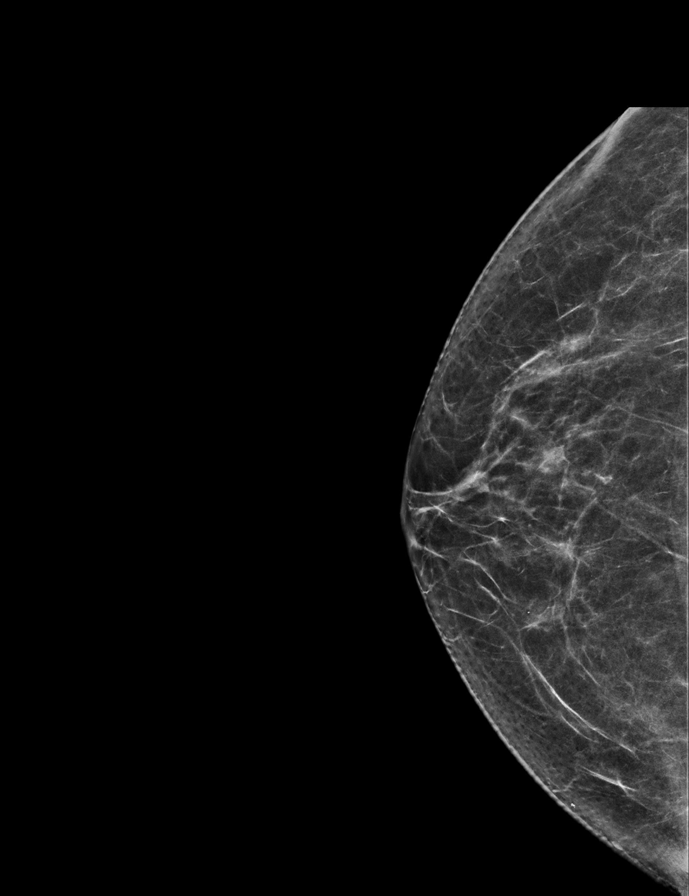

[R CC tomo · 2 of 62 frames shown]
[frame 21/62]
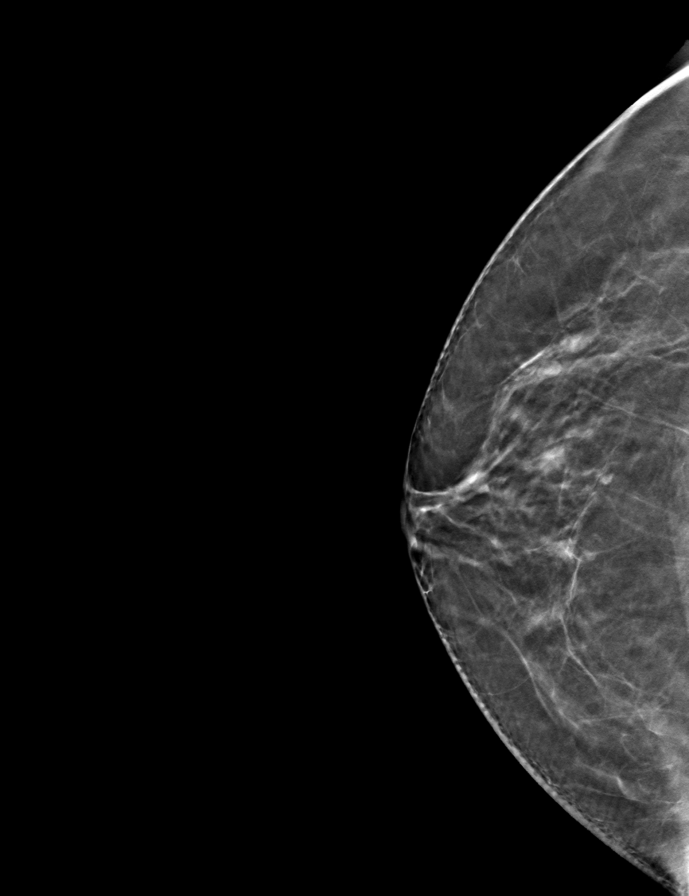
[frame 31/62]
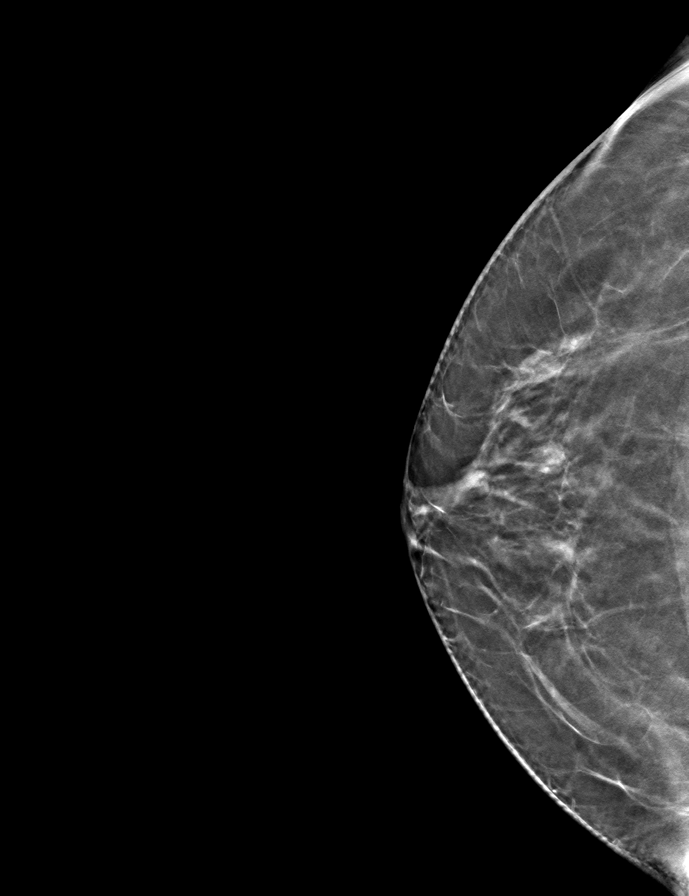

[R MLO tomo · tomo slice 35/68.0]
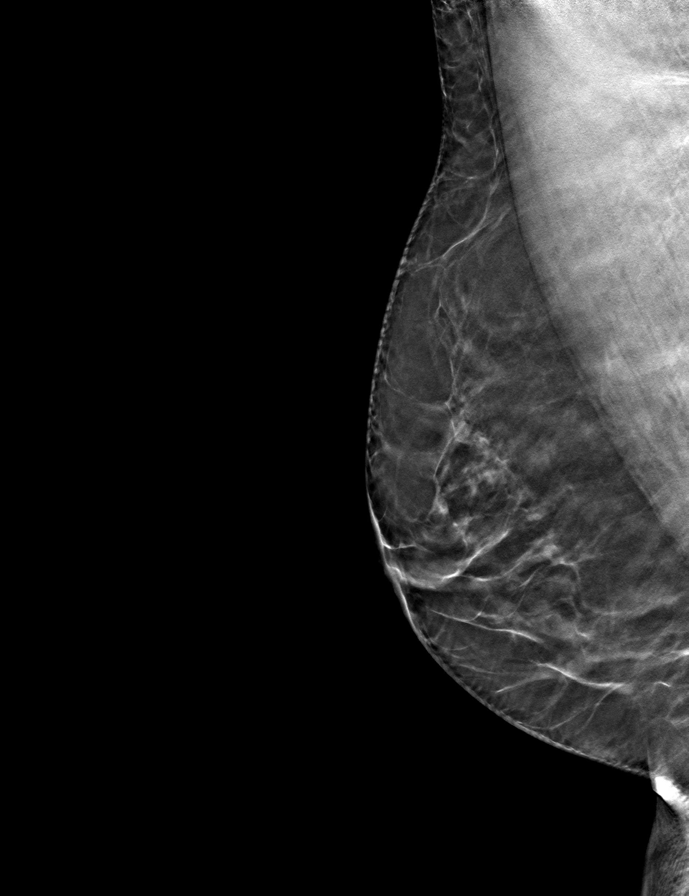

[L MLO tomo · tomo slice 31/62.0]
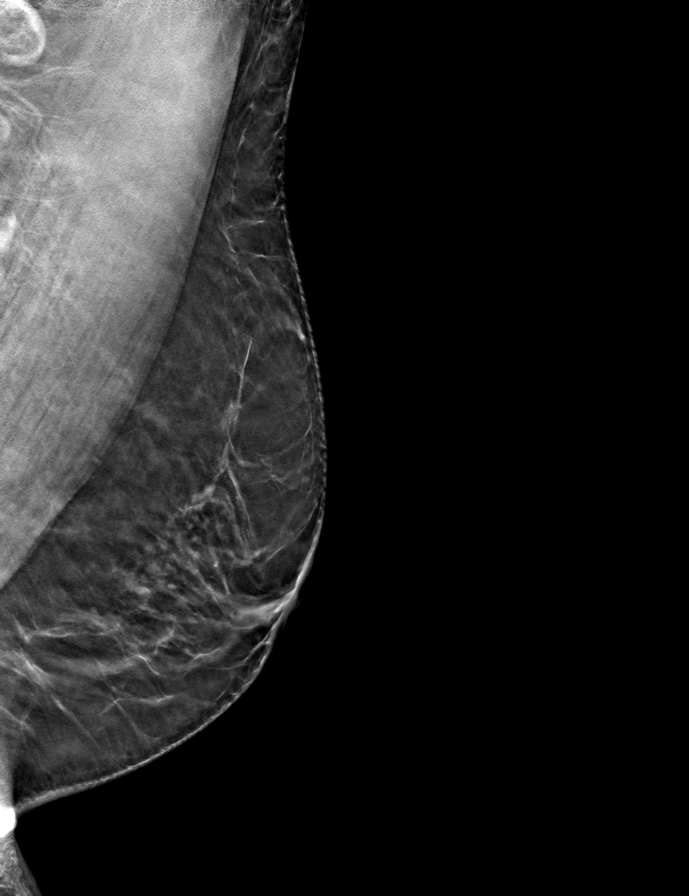

[L CC tomo · tomo slice 35/70.0]
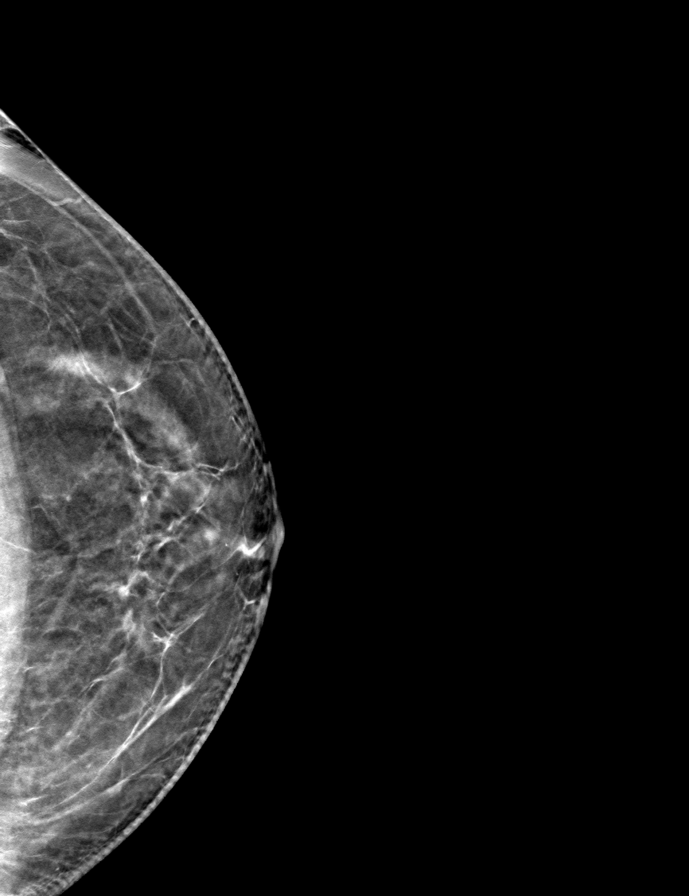

[9 of 24 positions shown; findings below may reference images not displayed]

ACR Breast Density Category b: There are scattered areas of
fibroglandular density.
FINDINGS: There are no findings suspicious for malignancy. Images were
processed with CAD.
IMPRESSION: No mammographic evidence of malignancy. A result letter of this
screening mammogram will be mailed directly to the patient.

RECOMMENDATION:
Screening mammogram in one year. (Code:[id number])

BI-RADS CATEGORY  1: Negative.

## 2020-06-22 ENCOUNTER — Other Ambulatory Visit: Payer: Self-pay

## 2020-06-22 ENCOUNTER — Encounter: Payer: Self-pay | Admitting: Family Medicine

## 2020-06-22 ENCOUNTER — Ambulatory Visit (INDEPENDENT_AMBULATORY_CARE_PROVIDER_SITE_OTHER): Payer: Managed Care, Other (non HMO) | Admitting: Family Medicine

## 2020-06-22 VITALS — BP 119/79 | HR 101 | Ht 64.0 in | Wt 165.0 lb

## 2020-06-22 DIAGNOSIS — Z01419 Encounter for gynecological examination (general) (routine) without abnormal findings: Secondary | ICD-10-CM

## 2020-06-22 DIAGNOSIS — Z23 Encounter for immunization: Secondary | ICD-10-CM | POA: Diagnosis not present

## 2020-06-22 NOTE — Patient Instructions (Signed)
Preventive Care 66 Years and Older, Female Preventive care refers to lifestyle choices and visits with your health care provider that can promote health and wellness. This includes:  A yearly physical exam. This is also called an annual well check.  Regular dental and eye exams.  Immunizations.  Screening for certain conditions.  Healthy lifestyle choices, such as diet and exercise. What can I expect for my preventive care visit? Physical exam Your health care provider will check:  Height and weight. These may be used to calculate body mass index (BMI), which is a measurement that tells if you are at a healthy weight.  Heart rate and blood pressure.  Your skin for abnormal spots. Counseling Your health care provider may ask you questions about:  Alcohol, tobacco, and drug use.  Emotional well-being.  Home and relationship well-being.  Sexual activity.  Eating habits.  History of falls.  Memory and ability to understand (cognition).  Work and work Statistician.  Pregnancy and menstrual history. What immunizations do I need?  Influenza (flu) vaccine  This is recommended every year. Tetanus, diphtheria, and pertussis (Tdap) vaccine  You may need a Td booster every 10 years. Varicella (chickenpox) vaccine  You may need this vaccine if you have not already been vaccinated. Zoster (shingles) vaccine  You may need this after age 66. Pneumococcal conjugate (PCV13) vaccine  One dose is recommended after age 66. Pneumococcal polysaccharide (PPSV23) vaccine  One dose is recommended after age 66. Measles, mumps, and rubella (MMR) vaccine  You may need at least one dose of MMR if you were born in 1957 or later. You may also need a second dose. Meningococcal conjugate (MenACWY) vaccine  You may need this if you have certain conditions. Hepatitis A vaccine  You may need this if you have certain conditions or if you travel or work in places where you may be exposed  to hepatitis A. Hepatitis B vaccine  You may need this if you have certain conditions or if you travel or work in places where you may be exposed to hepatitis B. Haemophilus influenzae type b (Hib) vaccine  You may need this if you have certain conditions. You may receive vaccines as individual doses or as more than one vaccine together in one shot (combination vaccines). Talk with your health care provider about the risks and benefits of combination vaccines. What tests do I need? Blood tests  Lipid and cholesterol levels. These may be checked every 5 years, or more frequently depending on your overall health.  Hepatitis C test.  Hepatitis B test. Screening  Lung cancer screening. You may have this screening every year starting at age 66 if you have a 30-pack-year history of smoking and currently smoke or have quit within the past 15 years.  Colorectal cancer screening. All adults should have this screening starting at age 66 and continuing until age 15. Your health care provider may recommend screening at age 66 if you are at increased risk. You will have tests every 1-10 years, depending on your results and the type of screening test.  Diabetes screening. This is done by checking your blood sugar (glucose) after you have not eaten for a while (fasting). You may have this done every 1-3 years.  Mammogram. This may be done every 1-2 years. Talk with your health care provider about how often you should have regular mammograms.  BRCA-related cancer screening. This may be done if you have a family history of breast, ovarian, tubal, or peritoneal cancers.  Other tests  Sexually transmitted disease (STD) testing.  Bone density scan. This is done to screen for osteoporosis. You may have this done starting at age 66. Follow these instructions at home: Eating and drinking  Eat a diet that includes fresh fruits and vegetables, whole grains, lean protein, and low-fat dairy products. Limit  your intake of foods with high amounts of sugar, saturated fats, and salt.  Take vitamin and mineral supplements as recommended by your health care provider.  Do not drink alcohol if your health care provider tells you not to drink.  If you drink alcohol: ? Limit how much you have to 0-1 drink a [place]. ? Be aware of how much alcohol is in your drink. In the [place] one drink equals one 12 oz bottle of beer (355 mL), one 5 oz glass of wine (148 mL), or one 1 oz glass of hard liquor (44 mL). Lifestyle  Take daily care of your teeth and gums.  Stay active. Exercise for at least 30 minutes on 5 or more days each week.  Do not use any products that contain nicotine or tobacco, such as cigarettes, e-cigarettes, and chewing tobacco. If you need help quitting, ask your health care provider.  If you are sexually active, practice safe sex. Use a condom or other form of protection in order to prevent STIs (sexually transmitted infections).  Talk with your health care provider about taking a low-dose aspirin or statin. What's next?  Go to your health care provider once a year for a well check visit.  Ask your health care provider how often you should have your eyes and teeth checked.  Stay up to date on all vaccines. This information is not intended to replace advice given to you by your health care provider. Make sure you discuss any questions you have with your health care provider. Document Revised: 08/06/2018 Document Reviewed: 08/06/2018 Elsevier Patient Education  2020 [employer].

## 2020-06-22 NOTE — Progress Notes (Signed)
  Subjective:     Victoria Randall is a 64 y.o. female and is here for a comprehensive physical exam. The patient reports no problems. Menopausal since late 71s. She is not sexually active. She has not had any concerns.   The following portions of the patient's history were reviewed and updated as appropriate: allergies, current medications, past family history, past medical history, past social history, past surgical history and problem list.  Review of Systems Pertinent items noted in HPI and remainder of comprehensive ROS otherwise negative.   Objective:    BP 119/79   Pulse (!) 101   Ht 5\' 4"  (1.626 m)   Wt 165 lb (74.8 kg)   BMI 28.32 kg/m  General appearance: alert, cooperative and appears stated age Head: Normocephalic, without obvious abnormality, atraumatic Neck: no adenopathy, supple, symmetrical, trachea midline and thyroid not enlarged, symmetric, no tenderness/mass/nodules Lungs: clear to auscultation bilaterally Breasts: normal appearance, no masses or tenderness Heart: regular rate and rhythm, S1, S2 normal, no murmur, click, rub or gallop Abdomen: soft, non-tender; bowel sounds normal; no masses,  no organomegaly Pelvic: cervix normal in appearance, external genitalia normal, no adnexal masses or tenderness, no cervical motion tenderness, uterus normal size, shape, and consistency and vaginal atrophy, uterus and ovaries are small and postmenopausal Extremities: Homans sign is negative, no sign of DVT Pulses: 2+ and symmetric Skin: Skin color, texture, turgor normal. No rashes or lesions Lymph nodes: Cervical, supraclavicular, and axillary nodes normal. Neurologic: Grossly normal    Assessment:    GYN female exam.      Plan:     Encounter for gynecological examination without abnormal finding - does not need pap smears due to age. - Plan: MM 3D SCREEN BREAST BILATERAL, Flu Vaccine QUAD 36+ mos IM (Fluarix, Quad PF)  Return in 1 year (on 06/22/2021).  See After  Visit Summary for Counseling Recommendations

## 2020-08-10 ENCOUNTER — Telehealth: Payer: Self-pay | Admitting: Family Medicine

## 2020-08-10 NOTE — Telephone Encounter (Signed)
Pt called in wanted to know about pneumonia vaccine and wanted to know about labs due to the change of medication dosage.

## 2020-08-15 ENCOUNTER — Telehealth: Payer: Self-pay

## 2020-08-15 ENCOUNTER — Ambulatory Visit (INDEPENDENT_AMBULATORY_CARE_PROVIDER_SITE_OTHER): Payer: Managed Care, Other (non HMO)

## 2020-08-15 ENCOUNTER — Other Ambulatory Visit: Payer: Self-pay

## 2020-08-15 DIAGNOSIS — Z23 Encounter for immunization: Secondary | ICD-10-CM

## 2020-08-15 NOTE — Progress Notes (Signed)
Per orders of Dr. Selena Batten, injection of Prevnar 13 and Shingrix, given by Selina Cooley, RN. Patient tolerated injection well.

## 2020-08-15 NOTE — Telephone Encounter (Signed)
Pt on schedule to [facility] for [place] for pneumonia and shingrix. Per Dr. Selena Batten pt is to get prevnar 13. Need to make sure pt is not on Medicare. If pt is on Medicare she must get shingrix at pharmacy or Medicare won't pay for it. LVM to check insurance.

## 2020-08-15 NOTE — Telephone Encounter (Signed)
Spoke to pt and she has already had her pneumonia shot but was still inquiring about her thyroid labs. Pt's last TSH was 09/16/19. Future orders have been in for a recheck. Pt advised to make a lab appt for this lab and then followed by a follow-up appt with Dr. Selena Batten. Pt did so.

## 2020-08-16 ENCOUNTER — Ambulatory Visit
Admission: RE | Admit: 2020-08-16 | Discharge: 2020-08-16 | Disposition: A | Discharge status: Home / Self Care | Payer: Managed Care, Other (non HMO) | Source: Ambulatory Visit | Attending: Family Medicine | Admitting: Family Medicine

## 2020-08-16 ENCOUNTER — Other Ambulatory Visit: Payer: Self-pay

## 2020-08-16 DIAGNOSIS — Z01419 Encounter for gynecological examination (general) (routine) without abnormal findings: Secondary | ICD-10-CM | POA: Diagnosis not present

## 2020-08-16 DIAGNOSIS — Z1231 Encounter for screening mammogram for malignant neoplasm of breast: Secondary | ICD-10-CM | POA: Diagnosis present

## 2020-08-21 ENCOUNTER — Other Ambulatory Visit: Payer: Self-pay

## 2020-08-21 ENCOUNTER — Other Ambulatory Visit (INDEPENDENT_AMBULATORY_CARE_PROVIDER_SITE_OTHER): Payer: Managed Care, Other (non HMO)

## 2020-08-21 DIAGNOSIS — R7309 Other abnormal glucose: Secondary | ICD-10-CM

## 2020-08-21 DIAGNOSIS — E039 Hypothyroidism, unspecified: Secondary | ICD-10-CM

## 2020-08-21 LAB — HEMOGLOBIN A1C: Hgb A1c MFr Bld: 6 % (ref 4.6–6.5)

## 2020-08-21 LAB — TSH: TSH: 1.11 u[IU]/mL (ref 0.35–4.50)

## 2020-08-22 LAB — T3: T3, Total: 71 ng/dL — ABNORMAL LOW (ref 76–181)

## 2020-08-24 ENCOUNTER — Ambulatory Visit (INDEPENDENT_AMBULATORY_CARE_PROVIDER_SITE_OTHER): Payer: Managed Care, Other (non HMO) | Admitting: Family Medicine

## 2020-08-24 ENCOUNTER — Other Ambulatory Visit: Payer: Self-pay

## 2020-08-24 VITALS — BP 140/84 | HR 78 | Temp 98.0°F | Ht 64.0 in | Wt 163.4 lb

## 2020-08-24 DIAGNOSIS — E782 Mixed hyperlipidemia: Secondary | ICD-10-CM

## 2020-08-24 DIAGNOSIS — E785 Hyperlipidemia, unspecified: Secondary | ICD-10-CM | POA: Insufficient documentation

## 2020-08-24 DIAGNOSIS — E039 Hypothyroidism, unspecified: Secondary | ICD-10-CM | POA: Diagnosis not present

## 2020-08-24 DIAGNOSIS — E2839 Other primary ovarian failure: Secondary | ICD-10-CM

## 2020-08-24 MED ORDER — LEVOTHYROXINE SODIUM 125 MCG PO TABS: 125.0000 ug | ORAL_TABLET | Freq: Every day | ORAL | 3 refills | Status: DC

## 2020-08-24 NOTE — Progress Notes (Signed)
Subjective:     Victoria Randall is a 64 y.o. female presenting for Follow-up (Thyroids)     HPI   #Hypothyroidism - is doing well  - no issues or concerns -   #elevated blood pressure - has been taking more aleve recently - recent meniscus repair surgery on the right and planning one for the left now  Review of Systems   Social History   Tobacco Use  Smoking Status Former Smoker  . Packs/Balin: 0.25  . Years: 10.00  . Pack years: 2.50  . Types: Cigarettes  . Quit date: 08/22/1985  . Years since quitting: 35.0  Smokeless Tobacco Never Used        Objective:    BP Readings from Last 3 Encounters:  08/24/20 140/84  06/22/20 119/79  01/31/20 120/84   Wt Readings from Last 3 Encounters:  08/24/20 163 lb 6.4 oz (74.1 kg)  06/22/20 165 lb (74.8 kg)  01/31/20 165 lb 8 oz (75.1 kg)    BP 140/84 (BP Location: Right Arm, Patient Position: Sitting, Cuff Size: Normal)   Pulse 78   Temp 98 F (36.7 C) (Temporal)   Ht 5\' 4"  (1.626 m)   Wt 163 lb 6.4 oz (74.1 kg)   SpO2 96%   BMI 28.05 kg/m    Physical Exam Constitutional:      General: She is not in acute distress.    Appearance: She is well-developed. She is not diaphoretic.  HENT:     Right Ear: External ear normal.     Left Ear: External ear normal.  Eyes:     Conjunctiva/sclera: Conjunctivae normal.  Cardiovascular:     Rate and Rhythm: Normal rate.  Pulmonary:     Effort: Pulmonary effort is normal.  Musculoskeletal:     Cervical back: Neck supple.  Skin:    General: Skin is warm and dry.     Capillary Refill: Capillary refill takes less than 2 seconds.  Neurological:     Mental Status: She is alert. Mental status is at baseline.  Psychiatric:        Mood and Affect: Mood normal.        Behavior: Behavior normal.     The 10-year ASCVD risk score DC Jr., et al., 2013) is: 6.4%   Values used to calculate the score:     Age: 53 years     Sex: Female     Is Non-Hispanic African  American: No     Diabetic: No     Tobacco smoker: No     Systolic Blood Pressure: 140 mmHg     Is BP treated: No     HDL Cholesterol: 81 mg/dL     Total Cholesterol: 203 mg/dL       Assessment & Plan:   Problem List Items Addressed This Visit      Endocrine   Hypothyroidism - Primary    Well controlled on recent labs. Cont synthroid 125 mcg      Relevant Medications   levothyroxine (SYNTHROID) 125 MCG tablet     Other   Hyperlipidemia    Healthy diet and exercise encouraged. Return in 3-6 months for annual and recheck labs       Other Visit Diagnoses    Estrogen deficiency       Relevant Orders   DG Bone Density       Return in about 4 months (around 12/23/2020) for annual wellness.  12/25/2020, MD  This visit  occurred during the SARS-CoV-2 public health emergency.  Safety protocols were in place, including screening questions prior to the visit, additional usage of staff PPE, and extensive cleaning of exam room while observing appropriate contact time as indicated for disinfecting solutions.

## 2020-08-24 NOTE — Assessment & Plan Note (Signed)
Well controlled on recent labs. Cont synthroid 125 mcg

## 2020-08-24 NOTE — Patient Instructions (Addendum)
Please call the location of your choice from the menu below to schedule your Mammogram and/or Bone Density appointment.     [place]  1. [facility]   Phone:  [phone]   [address]                                                                            [place], [place] [address]                                            Services: 3D Mammogram and Bone Density  Check with insurance about Bone Density/Dexa scan

## 2020-08-24 NOTE — Assessment & Plan Note (Signed)
Healthy diet and exercise encouraged. Return in 3-6 months for annual and recheck labs

## 2020-09-27 ENCOUNTER — Other Ambulatory Visit: Payer: Self-pay

## 2020-09-27 ENCOUNTER — Encounter (HOSPITAL_COMMUNITY): Payer: Self-pay | Admitting: Anesthesiology

## 2020-09-27 ENCOUNTER — Encounter (HOSPITAL_BASED_OUTPATIENT_CLINIC_OR_DEPARTMENT_OTHER): Payer: Self-pay | Admitting: Orthopedic Surgery

## 2020-09-27 NOTE — Anesthesia Preprocedure Evaluation (Deleted)
Anesthesia Evaluation    Reviewed: Allergy & Precautions, Patient's Chart, lab work & pertinent test results  Airway        Dental   Pulmonary former smoker,           Cardiovascular Exercise Tolerance: Good      Neuro/Psych negative neurological ROS  negative psych ROS   GI/Hepatic Neg liver ROS, GERD  ,  Endo/Other  Hypothyroidism   Renal/GU negative Renal ROS     Musculoskeletal  (+) Arthritis ,   Abdominal   Peds  Hematology negative hematology ROS (+)   Anesthesia Other Findings   Reproductive/Obstetrics                             Anesthesia Physical Anesthesia Plan  ASA: II  Anesthesia Plan: General   Post-op Pain Management:    Induction: Intravenous  PONV Risk Score and Plan: 4 or greater and Treatment may vary due to age or medical condition, Ondansetron, Dexamethasone and Midazolam  Airway Management Planned: LMA  Additional Equipment: None  Intra-op Plan:   Post-operative Plan:   Informed Consent:     Dental advisory given  Plan Discussed with: CRNA and Anesthesiologist  Anesthesia Plan Comments:         Anesthesia Quick Evaluation

## 2020-09-28 ENCOUNTER — Ambulatory Visit (HOSPITAL_BASED_OUTPATIENT_CLINIC_OR_DEPARTMENT_OTHER)
Admission: RE | Admit: 2020-09-28 | Payer: Managed Care, Other (non HMO) | Source: Home / Self Care | Admitting: Orthopedic Surgery

## 2020-09-28 HISTORY — DX: Unspecified osteoarthritis, unspecified site: M19.90

## 2020-09-28 HISTORY — DX: Chondromalacia, left knee: M94.262

## 2020-09-28 SURGERY — CHONDROPLASTY
Anesthesia: General | Site: Knee | Laterality: Left

## 2020-09-28 MED ORDER — PROPOFOL 10 MG/ML IV BOLUS
INTRAVENOUS | Status: AC
  Filled 2020-09-28: qty 20

## 2020-09-28 NOTE — H&P (Signed)
HPI:   Pt. Presenting for evaluation of continued left knee pain. MRI shows a posterior meniscal tear on the medial aspect. Also some degenerative changes in the patellofemoral and medial joint. Small meniscus tear.   Past Medical History:  Diagnosis Date  . Arthritis    knees  . Chondromalacia of knee, left   . Gallstones   . GERD (gastroesophageal reflux disease)    hx of  . Hypothyroidism      Past Surgical History:  Procedure Laterality Date  . CHOLECYSTECTOMY    . COLONOSCOPY WITH PROPOFOL N/A 08/27/2017   Procedure: COLONOSCOPY WITH PROPOFOL;  Surgeon: Andria Meuse, MD;  Location: [facility] ENDOSCOPY;  Service: General;  Laterality: N/A;  . HERNIA REPAIR    . ORTHOPEDIC SURGERY Right    knee  . SPIGELIAN HERNIA  08/2017  . UMBILICAL HERNIA REPAIR N/A 08/29/2017   Procedure: DIAGNOSTIC LAPAROSCOPY PRIMARY REPAIR OF LEFT SPIGELIAN HERNIA  AND PRIMARY REPAIR OF UMBILICAL HERNIA;  Surgeon: Darnell Level, MD;  Location: [facility] ORS;  Service: General;  Laterality: N/A;  . UMBILICAL HERNIA REPAIR       Family History  Problem Relation Age of Onset  . Other Mother        squamous cell tumor, metabolic acidosis  . Squamous cell carcinoma Mother   . Lung cancer Father   . Cancer - Lung Father   . Heart disease Brother        bypass surgery in his early 77s  . Stroke Paternal Grandfather   . Heart attack Maternal Aunt        massive, in her 43s  . Breast cancer Neg Hx   . Colon cancer Neg Hx      Current Medications No current facility-administered medications for this encounter.   Marland Kitchen aspirin EC 81 MG tablet  . levothyroxine (SYNTHROID) 125 MCG tablet     Allergies Patient has no known allergies.  Social History  reports that she quit smoking about 35 years ago. Her smoking use included cigarettes. She has a 2.50 pack-year smoking history. She has never used smokeless tobacco. She reports current alcohol use. She reports that she does not use drugs.    Physical  Exam General: Well appearing, in NAD Mental status: Alert and Oriented x3 Lungs: CTA b/l anterior and posterior without crackles or wheeze Heart: RRR, no m/g/r appreciated Abdomen: +BS, soft, nt, nd, no masses, hernias, or organomegaly appreciated Neurological: Speech Clear and organized, no gross focal findings or movement disorder appreciated Musculoskeletal: no gross joint deformity or swelling.  Observed gait normal Extremities: LLE: TTP over the medial joint line. NVI. Positive flexion pinch. Others: Warm and well perfused w/o edema Skin: Warm and dry    MRI of the left knee    posterior meniscal tear on the medial aspect. Also some degenerative changes in the patellofemoral and medial joint. Small meniscus tear.    Assessment and Plan  Had a long talk with her. She has known arthritis but she has had new mechanical symptoms and a pop since her injury. I discussed the risks and benefits of an arthroscopic partial medial and possible lateral menisectomy and chondroplasty. She would like to proceed with the plan.

## 2020-09-28 NOTE — Discharge Instructions (Signed)
Keep leg elevated with ice to reduce pain and swelling. You may increase pain medication up to 2 tablets every 4 hours for the first few days post op if needed.  Stop as needed pain medication as soon as you are able.  Diet: As you were doing prior to hospitalization   Dressing:  You may remove dressings and shower over incisions 3 days after surgery.  Place clean Band-Aid over incisions.  Continue to use ace wrap for compression to reduce swelling.   Activity:  Increase activity slowly as tolerated, but follow the weight bearing instructions below.  The rules on driving is that you can not be taking narcotics while you drive, and you must feel in control of the vehicle.    Weight Bearing: As tolerated   To prevent constipation: you may use a stool softener such as -  Colace (over the counter) 100 mg by mouth twice a [place]  Drink plenty of fluids (prune juice may be helpful) and high fiber foods Miralax (over the counter) for constipation as needed.    Itching:  If you experience itching with your medications, try taking only a single pain pill, or even half a pain pill at a time.  You can also use benadryl over the counter for itching or also to help with sleep.   Precautions:  If you experience chest pain or shortness of breath - call 911 immediately for transfer to the hospital emergency department!!  If you develop a fever greater that 101 F, purulent drainage from wound, increased redness or drainage from wound, or calf pain -- Call the office at [phone]                                                 Follow- Up Appointment:  Please call for an appointment to be seen in 2 weeks [place] - [phone]   

## 2020-11-29 ENCOUNTER — Ambulatory Visit: Payer: Managed Care, Other (non HMO)

## 2020-12-21 ENCOUNTER — Ambulatory Visit (INDEPENDENT_AMBULATORY_CARE_PROVIDER_SITE_OTHER): Payer: Managed Care, Other (non HMO) | Admitting: Family Medicine

## 2020-12-21 ENCOUNTER — Other Ambulatory Visit: Payer: Self-pay

## 2020-12-21 ENCOUNTER — Other Ambulatory Visit: Payer: Self-pay | Admitting: Family Medicine

## 2020-12-21 ENCOUNTER — Encounter: Payer: Self-pay | Admitting: Family Medicine

## 2020-12-21 VITALS — BP 120/82 | HR 82 | Temp 97.4°F | Ht 63.5 in | Wt 166.5 lb

## 2020-12-21 DIAGNOSIS — E039 Hypothyroidism, unspecified: Secondary | ICD-10-CM

## 2020-12-21 DIAGNOSIS — R7303 Prediabetes: Secondary | ICD-10-CM | POA: Diagnosis not present

## 2020-12-21 DIAGNOSIS — E782 Mixed hyperlipidemia: Secondary | ICD-10-CM

## 2020-12-21 DIAGNOSIS — Z23 Encounter for immunization: Secondary | ICD-10-CM

## 2020-12-21 DIAGNOSIS — E2839 Other primary ovarian failure: Secondary | ICD-10-CM

## 2020-12-21 DIAGNOSIS — Z0000 Encounter for general adult medical examination without abnormal findings: Secondary | ICD-10-CM

## 2020-12-21 LAB — COMPREHENSIVE METABOLIC PANEL
ALT: 13 U/L (ref 0–35)
AST: 15 U/L (ref 0–37)
Albumin: 4.3 g/dL (ref 3.5–5.2)
Alkaline Phosphatase: 56 U/L (ref 39–117)
BUN: 20 mg/dL (ref 6–23)
CO2: 30 mEq/L (ref 19–32)
Calcium: 9.4 mg/dL (ref 8.4–10.5)
Chloride: 105 mEq/L (ref 96–112)
Creatinine, Ser: 0.86 mg/dL (ref 0.40–1.20)
GFR: 70.24 mL/min (ref >60.00)
Glucose, Bld: 102 mg/dL — ABNORMAL HIGH (ref 70–99)
Potassium: 4.1 mEq/L (ref 3.5–5.1)
Sodium: 141 mEq/L (ref 135–145)
Total Bilirubin: 0.6 mg/dL (ref 0.2–1.2)
Total Protein: 6.5 g/dL (ref 6.0–8.3)

## 2020-12-21 LAB — LIPID PANEL
Cholesterol: 214 mg/dL — ABNORMAL HIGH (ref 0–200)
HDL: 74.3 mg/dL (ref >39.00)
LDL Cholesterol: 119 mg/dL — ABNORMAL HIGH (ref 0–99)
NonHDL: 139.27
Total CHOL/HDL Ratio: 3
Triglycerides: 103 mg/dL (ref 0.0–149.0)
VLDL: 20.6 mg/dL (ref 0.0–40.0)

## 2020-12-21 LAB — TSH: TSH: 0.14 u[IU]/mL — ABNORMAL LOW (ref 0.35–4.50)

## 2020-12-21 LAB — HEMOGLOBIN A1C: Hgb A1c MFr Bld: 6 % (ref 4.6–6.5)

## 2020-12-21 LAB — CBC
HCT: 41.7 % (ref 36.0–46.0)
Hemoglobin: 14 g/dL (ref 12.0–15.0)
MCHC: 33.6 g/dL (ref 30.0–36.0)
MCV: 87.8 fl (ref 78.0–100.0)
Platelets: 193 10*3/uL (ref 150.0–400.0)
RBC: 4.75 Mil/uL (ref 3.87–5.11)
RDW: 13 % (ref 11.5–15.5)
WBC: 5.8 10*3/uL (ref 4.0–10.5)

## 2020-12-21 MED ORDER — LEVOTHYROXINE SODIUM 112 MCG PO TABS: 112.0000 ug | ORAL_TABLET | Freq: Every day | ORAL | 0 refills | Status: DC

## 2020-12-21 NOTE — Progress Notes (Signed)
Annual Exam   Chief Complaint:  Chief Complaint  Patient presents with  . Annual Exam    No concerns     History of Present Illness:  Ms. Victoria Randall is a 65 y.o. G2P2 who LMP was No LMP recorded. Patient is postmenopausal., presents today for her annual examination.     Nutrition She does get adequate calcium and Vitamin D in her diet. Diet: pretty good, cooking a lot, does not eat out much Exercise: [employer] 2 bootcamps, weight class for women, walking when not in class  Safety The patient wears seatbelts: yes.     The patient feels safe at home and in their relationships: yes.   Menstrual:  Symptoms of menopause: none  GYN She is not currently sexually active   Cervical Cancer Screening (21-65):   Last Pap:   November 2019 Results were: no abnormalities /neg HPV DNA   Breast Cancer Screening (Age 12-74):  There is no FH of breast cancer. There is no FH of ovarian cancer. BRCA screening Not Indicated.  Last Mammogram: 07/2020 The patient does want a mammogram this year.    Colon Cancer Screening:  Age 76-75 yo - benefits outweigh the risk. Adults 40-85 yo who have never been screened benefit.  Benefits: 134000 people in 2016 will be diagnosed and 49,000 will die - early detection helps [place]: Complications 2/2 to colonoscopy High Risk (Colonoscopy): genetic disorder (Lynch syndrome or familial adenomatous polyposis), personal hx of IBD, previous adenomatous polyp, or previous colorectal cancer, FamHx start 10 years before the age at diagnosis, increased in males and black race  Options:  FIT - looks for hemoglobin (blood in the stool) - specific and fairly sensitive - must be done annually Cologuard - looks for DNA and blood - more sensitive - therefore can have more false positives, every 3 years Colonoscopy - every 10 years if normal - sedation, bowl prep, must have someone drive you  Shared decision making and the patient had decided to do colonoscopy - due in  2024.   Social History   Tobacco Use  Smoking Status Former Smoker  . Packs/Mcphillips: 0.25  . Years: 10.00  . Pack years: 2.50  . Types: Cigarettes  . Quit date: 08/22/1985  . Years since quitting: 35.3  Smokeless Tobacco Never Used    Lung Cancer Screening (Ages 56-21): not applicable   Weight Wt Readings from Last 3 Encounters:  12/21/20 166 lb 8 oz (75.5 kg)  08/24/20 163 lb 6.4 oz (74.1 kg)  06/22/20 165 lb (74.8 kg)   Patient has high BMI  BMI Readings from Last 1 Encounters:  12/21/20 29.03 kg/m     Chronic disease screening Blood pressure monitoring:  BP Readings from Last 3 Encounters:  12/21/20 120/82  08/24/20 140/84  06/22/20 119/79    Lipid Monitoring: Indication for screening: age >23, obesity, diabetes, family hx, CV risk factors.  Lipid screening: Yes  Lab Results  Component Value Date   CHOL 203 (H) 12/13/2019   HDL 81 12/13/2019   LDLCALC 108 (H) 12/13/2019   TRIG 78 12/13/2019   CHOLHDL 2.5 12/13/2019     Diabetes Screening: age >25, overweight, family hx, PCOS, hx of gestational diabetes, at risk ethnicity Diabetes Screening screening: Yes  Lab Results  Component Value Date   HGBA1C 6.0 08/21/2020     Past Medical History:  Diagnosis Date  . Arthritis    knees  . Chondromalacia of knee, left   . Gallstones   .  GERD (gastroesophageal reflux disease)    hx of  . Hypothyroidism     Past Surgical History:  Procedure Laterality Date  . CHOLECYSTECTOMY    . COLONOSCOPY WITH PROPOFOL N/A 08/27/2017   Procedure: COLONOSCOPY WITH PROPOFOL;  Surgeon: Ileana Roup, MD;  Location: [facility];  Service: General;  Laterality: N/A;  . HERNIA REPAIR    . left knee meniscus    . ORTHOPEDIC SURGERY Right    knee  . SPIGELIAN HERNIA  08/2017  . UMBILICAL HERNIA REPAIR N/A 08/29/2017   Procedure: DIAGNOSTIC LAPAROSCOPY PRIMARY REPAIR OF LEFT SPIGELIAN HERNIA  AND PRIMARY REPAIR OF UMBILICAL HERNIA;  Surgeon: Armandina Gemma, MD;   Location: [facility];  Service: General;  Laterality: N/A;  . UMBILICAL HERNIA REPAIR      Prior to Admission medications   Medication Sig Start Date End Date Taking? Authorizing Provider  aspirin EC 81 MG tablet Take 1 tablet (81 mg total) by mouth daily. 09/03/17  Yes Ileana Roup, MD  levothyroxine (SYNTHROID) 125 MCG tablet Take 1 tablet (125 mcg total) by mouth daily. 08/24/20  Yes Lesleigh Noe, MD    No Known Allergies  Gynecologic History: No LMP recorded. Patient is postmenopausal.  Obstetric History: G2P2  Social History   Socioeconomic History  . Marital status: Divorced    Spouse name: Not on file  . Number of children: 2  . Years of education: Bachelors degree  . Highest education level: Not on file  Occupational History  . Occupation: Dance movement psychotherapist  Tobacco Use  . Smoking status: Former Smoker    Packs/Stauber: 0.25    Years: 10.00    Pack years: 2.50    Types: Cigarettes    Quit date: 08/22/1985    Years since quitting: 35.3  . Smokeless tobacco: Never Used  Vaping Use  . Vaping Use: Never used  Substance and Sexual Activity  . Alcohol use: Yes    Comment: social  . Drug use: No  . Sexual activity: Not Currently    Birth control/protection: Post-menopausal  Other Topics Concern  . Not on file  Social History Narrative   09/16/19   From: [place] originally, but lived in [place] for 25 years   Living: alone   Work: Human resources officer for [employer]       Family: 2 children - Journalist, newspaper (Family Medicine Resident, in [place]) and Rodman Key - has 2 children (in [place])      Enjoys: exercise at [employer], visit children, traveling      Exercise: 3-4 times a week   Diet: could be better, overall pretty good, sweet tooth      Safety   Seat belts: Yes    Guns: No   Safe in relationships: Yes    Social Determinants of Radio broadcast assistant Strain: Not on file  Food Insecurity: Not on file  Transportation Needs: Not on file  Physical Activity: Not  on file  Stress: Not on file  Social Connections: Not on file  Intimate Partner Violence: Not on file    Family History  Problem Relation Age of Onset  . Other Mother        squamous cell tumor, metabolic acidosis  . Squamous cell carcinoma Mother   . Lung cancer Father   . Cancer - Lung Father   . Heart disease Brother        bypass surgery in his early 96s  . Stroke Paternal Grandfather   . Heart attack  Maternal Aunt        massive, in her 108s  . Breast cancer Neg Hx   . Colon cancer Neg Hx     Review of Systems  Constitutional: Negative for chills and fever.  HENT: Negative for congestion and sore throat.   Eyes: Negative for blurred vision and double vision.  Respiratory: Negative for shortness of breath.   Cardiovascular: Negative for chest pain.  Gastrointestinal: Negative for heartburn, nausea and vomiting.  Genitourinary: Negative.   Musculoskeletal: Negative.  Negative for myalgias.  Skin: Negative for rash.  Neurological: Negative for dizziness and headaches.  Endo/Heme/Allergies: Does not bruise/bleed easily.  Psychiatric/Behavioral: Negative for depression. The patient is not nervous/anxious.      Physical Exam BP 120/82   Pulse 82   Temp (!) 97.4 F (36.3 C) (Temporal)   Ht 5' 3.5" (1.613 m)   Wt 166 lb 8 oz (75.5 kg)   SpO2 99%   BMI 29.03 kg/m    BP Readings from Last 3 Encounters:  12/21/20 120/82  08/24/20 140/84  06/22/20 119/79      Physical Exam Constitutional:      General: She is not in acute distress.    Appearance: She is well-developed. She is not diaphoretic.  HENT:     Head: Normocephalic and atraumatic.     Right Ear: External ear normal.     Left Ear: External ear normal.     Nose: Nose normal.  Eyes:     General: No scleral icterus.    Conjunctiva/sclera: Conjunctivae normal.  Cardiovascular:     Rate and Rhythm: Normal rate and regular rhythm.     Heart sounds: No murmur heard.   Pulmonary:     Effort: Pulmonary  effort is normal. No respiratory distress.     Breath sounds: Normal breath sounds. No wheezing.  Abdominal:     General: Bowel sounds are normal. There is no distension.     Palpations: Abdomen is soft. There is no mass.     Tenderness: There is no abdominal tenderness. There is no guarding or rebound.  Musculoskeletal:        General: Normal range of motion.     Cervical back: Neck supple.  Lymphadenopathy:     Cervical: No cervical adenopathy.  Skin:    General: Skin is warm and dry.     Capillary Refill: Capillary refill takes less than 2 seconds.  Neurological:     Mental Status: She is alert and oriented to person, place, and time.     Deep Tendon Reflexes: Reflexes normal.  Psychiatric:        Behavior: Behavior normal.     Results:  PHQ-9:  [place] Office Visit from 08/21/2018 in [facility]   PHQ-9 Total Score 0        Assessment: 65 y.o. G2P2 female here for routine annual physical examination.  Plan: Problem List Items Addressed This Visit      Endocrine   Hypothyroidism   Relevant Orders   TSH     Other   Prediabetes   Relevant Orders   Hemoglobin A1c   Hyperlipidemia   Relevant Orders   Lipid panel    Other Visit Diagnoses    Annual physical exam    -  Primary   Relevant Orders   Comprehensive metabolic panel   CBC   Need for shingles vaccine       Relevant Orders   Varicella-zoster vaccine IM (Completed)   Estrogen  deficiency       Relevant Orders   DG Bone Density      Screening: -- Blood pressure screen normal -- cholesterol screening: will obtain -- Weight screening: overweight: continue to monitor -- Diabetes Screening: will obtain -- Nutrition: Encouraged healthy diet  The 10-year ASCVD risk score Mikey Bussing DC Jr., et al., 2013) is: 4.8%   Values used to calculate the score:     Age: 88 years     Sex: Female     Is Non-Hispanic African American: No     Diabetic: No     Tobacco smoker: No     Systolic  Blood Pressure: 120 mmHg     Is BP treated: No     HDL Cholesterol: 81 mg/dL     Total Cholesterol: 203 mg/dL  -- Statin therapy for Age 32-75 with CVD risk >7.5%  Psych -- Depression screening (PHQ-9):  [facility] Office Visit from 08/21/2018 in [facility]  PHQ-9 Total Score 0       Safety -- tobacco screening: not using -- alcohol screening:  low-risk usage. -- no evidence of domestic violence or intimate partner violence.   Cancer Screening -- pap smear not collected per ASCCP guidelines -- family history of breast cancer screening: done. not at high risk. -- Mammogram - up to date -- Colon cancer (age 7+)-- up to date    Dexa ordered for bone health screening  Immunizations Immunization History  Administered Date(s) Administered  . Influenza, Seasonal, Injecte, Preservative Fre 05/31/2019  . Influenza,inj,Quad PF,6+ Mos 06/22/2020  . PFIZER(Purple Top)SARS-COV-2 Vaccination 10/13/2019, 11/05/2019, 06/15/2020  . Pneumococcal Conjugate-13 08/15/2020  . Tdap 07/09/2018  . Zoster Recombinat (Shingrix) 08/15/2020, 12/21/2020    -- flu vaccine up to date -- TDAP q10 years up to date -- Shingles (age >46) up to date -- PCV-13 (age >62) - one dose followed by PPSV-23 1 year later up to date -- Covid-19 Vaccine up to date   Encouraged healthy diet and exercise. Encouraged regular vision and dental care.    Lesleigh Noe, MD

## 2020-12-21 NOTE — Patient Instructions (Signed)
Bone health promotion  1) 800 units of Vitamin D daily 2) Get 1200 mg of elemental calcium --- this is best from your diet. Try to track how much calcium you get on a typical [place]pp. You could find ways to add more (dairy products, leafy greens). Take a supplement for whatever you don't typically get so you reach 1200 mg of calcium.  3) Physical activity (ideally weight bearing) - like walking briskly 30 minutes 5 days a week.    Get Dexa scan when you get you mammogram  Labs today  Continue healthy diet and exercise  Make an eye appointment

## 2020-12-26 NOTE — Progress Notes (Signed)
Called left VM for her to call back.

## 2021-01-02 MED ORDER — LEVOTHYROXINE SODIUM 112 MCG PO TABS: 112.0000 ug | ORAL_TABLET | Freq: Every day | ORAL | 1 refills | Status: DC

## 2021-01-02 NOTE — Addendum Note (Signed)
Addended by: Roena Malady on: 01/02/2021 02:28 PM   Modules accepted: Orders

## 2021-02-01 ENCOUNTER — Other Ambulatory Visit (INDEPENDENT_AMBULATORY_CARE_PROVIDER_SITE_OTHER): Payer: Managed Care, Other (non HMO)

## 2021-02-01 ENCOUNTER — Other Ambulatory Visit: Payer: Self-pay

## 2021-02-01 DIAGNOSIS — E039 Hypothyroidism, unspecified: Secondary | ICD-10-CM | POA: Diagnosis not present

## 2021-02-01 LAB — TSH: TSH: 0.11 u[IU]/mL — ABNORMAL LOW (ref 0.35–4.50)

## 2021-02-02 ENCOUNTER — Other Ambulatory Visit: Payer: Self-pay | Admitting: Primary Care

## 2021-02-02 NOTE — Progress Notes (Unsigned)
From chart review, and her medication list, it looks like she is supposed to be taking levothyroxine 112 mcg, not 125 mcg.   She likely has a prescription on file at the pharmacy for the 112 mcg, have her pick this up and take. Repeat labs as scheduled.

## 2021-03-01 ENCOUNTER — Other Ambulatory Visit: Payer: Self-pay | Admitting: Family Medicine

## 2021-03-01 DIAGNOSIS — E039 Hypothyroidism, unspecified: Secondary | ICD-10-CM

## 2021-03-02 ENCOUNTER — Other Ambulatory Visit: Payer: Self-pay | Admitting: Family Medicine

## 2021-03-02 DIAGNOSIS — E039 Hypothyroidism, unspecified: Secondary | ICD-10-CM

## 2021-03-14 ENCOUNTER — Other Ambulatory Visit: Payer: Self-pay

## 2021-03-14 ENCOUNTER — Other Ambulatory Visit (INDEPENDENT_AMBULATORY_CARE_PROVIDER_SITE_OTHER): Payer: Managed Care, Other (non HMO)

## 2021-03-14 ENCOUNTER — Telehealth: Payer: Self-pay | Admitting: Family Medicine

## 2021-03-14 DIAGNOSIS — E039 Hypothyroidism, unspecified: Secondary | ICD-10-CM | POA: Diagnosis not present

## 2021-03-14 LAB — TSH: TSH: 1.94 u[IU]/mL (ref 0.35–5.50)

## 2021-03-14 NOTE — Telephone Encounter (Signed)
Spoke to pt. She came in to do her thyroid labs this morning. She was not aware of the Rx that was at the pharmacy from 03-02-21. She stated she took her last pill on Sunday so her labs may be off. I will let Dr Selena Batten know about the missed doses.

## 2021-03-14 NOTE — Telephone Encounter (Signed)
Noted will take into consideration

## 2021-03-14 NOTE — Telephone Encounter (Signed)
  LAST APPOINTMENT DATE:  Please schedule appointment if longer than 1 year  NEXT APPOINTMENT DATE:@Visit  date not found  MEDICATION: SYNTHROID 112 MCG tablet  Is the patient out of medication?   PHARMACY:[id number] - [place], [place] - [address]  Let patient know to contact pharmacy at the end of the [place] to make sure medication is ready.  Please notify patient to allow 48-72 hours to process  Encourage patient to contact the pharmacy for refills or they can request refills through [facility]  CLINICAL FILLS OUT ALL BELOW:  LAST REFILL:  QTY:  REFILL DATE:    OTHER COMMENTS:    Okay for refill?  Please advise

## 2021-05-04 ENCOUNTER — Other Ambulatory Visit: Payer: Self-pay | Admitting: Family Medicine

## 2021-05-04 DIAGNOSIS — E039 Hypothyroidism, unspecified: Secondary | ICD-10-CM

## 2021-05-15 ENCOUNTER — Encounter: Payer: Self-pay | Admitting: Radiology

## 2021-05-16 ENCOUNTER — Other Ambulatory Visit: Payer: Self-pay | Admitting: Family Medicine

## 2021-05-16 DIAGNOSIS — E039 Hypothyroidism, unspecified: Secondary | ICD-10-CM

## 2021-06-25 ENCOUNTER — Encounter: Payer: Self-pay | Admitting: Family Medicine

## 2021-08-08 ENCOUNTER — Other Ambulatory Visit (HOSPITAL_COMMUNITY)
Admission: RE | Admit: 2021-08-08 | Discharge: 2021-08-08 | Disposition: A | Discharge status: Home / Self Care | Payer: Managed Care, Other (non HMO) | Source: Ambulatory Visit | Attending: Family Medicine | Admitting: Family Medicine

## 2021-08-08 ENCOUNTER — Other Ambulatory Visit: Payer: Self-pay

## 2021-08-08 ENCOUNTER — Ambulatory Visit (INDEPENDENT_AMBULATORY_CARE_PROVIDER_SITE_OTHER): Payer: Managed Care, Other (non HMO) | Admitting: Family Medicine

## 2021-08-08 ENCOUNTER — Encounter: Payer: Self-pay | Admitting: Family Medicine

## 2021-08-08 VITALS — BP 125/74 | HR 86 | Ht 64.0 in | Wt 166.0 lb

## 2021-08-08 DIAGNOSIS — Z113 Encounter for screening for infections with a predominantly sexual mode of transmission: Secondary | ICD-10-CM | POA: Insufficient documentation

## 2021-08-08 DIAGNOSIS — Z1231 Encounter for screening mammogram for malignant neoplasm of breast: Secondary | ICD-10-CM | POA: Diagnosis not present

## 2021-08-08 DIAGNOSIS — Z01411 Encounter for gynecological examination (general) (routine) with abnormal findings: Secondary | ICD-10-CM

## 2021-08-08 NOTE — Progress Notes (Signed)
Subjective:     Victoria Randall is a 65 y.o. female and is here for a comprehensive physical exam. The patient reports no problems. Menopause in her 62s. Still working. Works from home. Has new sexual partner.   The following portions of the patient's history were reviewed and updated as appropriate: allergies, current medications, past family history, past medical history, past social history, past surgical history, and problem list.  Review of Systems Pertinent items noted in HPI and remainder of comprehensive ROS otherwise negative.   Objective:    BP 125/74    Pulse 86    Ht 5\' 4"  (1.626 m)    Wt 166 lb (75.3 kg)    BMI 28.49 kg/m  General appearance: alert, cooperative, and appears stated age Head: Normocephalic, without obvious abnormality, atraumatic Neck: no adenopathy, supple, symmetrical, trachea midline, and thyroid not enlarged, symmetric, no tenderness/mass/nodules Lungs: clear to auscultation bilaterally Breasts: normal appearance, no masses or tenderness Heart: regular rate and rhythm, S1, S2 normal, no murmur, click, rub or gallop Abdomen: soft, non-tender; bowel sounds normal; no masses,  no organomegaly Pelvic: external genitalia normal, no adnexal masses or tenderness, no cervical motion tenderness, uterus normal size, shape, and consistency, and vagina normal without discharge Extremities: extremities normal, atraumatic, no cyanosis or edema Pulses: 2+ and symmetric Skin: Skin color, texture, turgor normal. No rashes or lesions Lymph nodes: Cervical, supraclavicular, and axillary nodes normal. Neurologic: Grossly normal    Assessment:    GYN female exam.      Plan:  Routine screening for STI (sexually transmitted infection) - Plan: Hepatitis B surface antigen, Hepatitis C antibody, HIV Antibody (routine testing w rflx), RPR, Urine cytology ancillary only, CANCELED: Cervicovaginal ancillary only( [place])  Encounter for screening mammogram for malignant neoplasm  of breast - Plan: MM 3D SCREEN BREAST BILATERAL  Encounter for gynecological examination with abnormal finding - Pap smears are no longer indicated due to age.  Return in 1 year (on 08/08/2022) for schedule dexa scan and mammogram please.    See After Visit Summary for Counseling Recommendations

## 2021-08-08 NOTE — Progress Notes (Signed)
Patient presents for Annual Exam today.  CC: None   Mammogram: 08/16/20 WNL Last pap: 07/09/18 WNL  Family Hx of Breast Cancer: None  Family Hx of Ovarian/Cervical Cancer:None  *Pt recently became sexually active after being separated for 7 yrs has some questions for provider.

## 2021-08-09 LAB — RPR: RPR Ser Ql: NONREACTIVE

## 2021-08-09 LAB — HIV ANTIBODY (ROUTINE TESTING W REFLEX): HIV Screen 4th Generation wRfx: NONREACTIVE

## 2021-08-09 LAB — URINE CYTOLOGY ANCILLARY ONLY
Chlamydia: NEGATIVE
Comment: NEGATIVE
Comment: NORMAL
Neisseria Gonorrhea: NEGATIVE

## 2021-08-09 LAB — HEPATITIS B SURFACE ANTIGEN: Hepatitis B Surface Ag: NEGATIVE

## 2021-08-09 LAB — HEPATITIS C ANTIBODY: Hep C Virus Ab: <0.1 s/co ratio (ref 0.0–0.9)

## 2021-08-15 ENCOUNTER — Other Ambulatory Visit: Payer: Self-pay | Admitting: Family Medicine

## 2021-08-15 DIAGNOSIS — E039 Hypothyroidism, unspecified: Secondary | ICD-10-CM

## 2021-08-17 NOTE — Telephone Encounter (Signed)
Mychart sent.

## 2021-08-28 NOTE — Telephone Encounter (Signed)
Victoria Randall called in and stated that she was taking the generic brand due to the insurance dropped the name brand

## 2021-08-29 NOTE — Telephone Encounter (Signed)
Refill sent in

## 2021-10-08 ENCOUNTER — Ambulatory Visit
Admission: RE | Admit: 2021-10-08 | Discharge: 2021-10-08 | Disposition: A | Discharge status: Home / Self Care | Payer: 59 | Source: Ambulatory Visit | Attending: Family Medicine | Admitting: Family Medicine

## 2021-10-08 ENCOUNTER — Other Ambulatory Visit: Payer: Self-pay

## 2021-10-08 DIAGNOSIS — Z1231 Encounter for screening mammogram for malignant neoplasm of breast: Secondary | ICD-10-CM | POA: Insufficient documentation

## 2021-10-08 DIAGNOSIS — E2839 Other primary ovarian failure: Secondary | ICD-10-CM | POA: Diagnosis present

## 2021-12-25 ENCOUNTER — Encounter: Payer: Managed Care, Other (non HMO) | Admitting: Family Medicine

## 2022-01-28 ENCOUNTER — Ambulatory Visit (INDEPENDENT_AMBULATORY_CARE_PROVIDER_SITE_OTHER): Payer: 59 | Admitting: Family Medicine

## 2022-01-28 VITALS — BP 124/82 | HR 66 | Temp 97.5°F | Ht 63.5 in | Wt 160.5 lb

## 2022-01-28 DIAGNOSIS — R7303 Prediabetes: Secondary | ICD-10-CM

## 2022-01-28 DIAGNOSIS — Z0000 Encounter for general adult medical examination without abnormal findings: Secondary | ICD-10-CM

## 2022-01-28 DIAGNOSIS — E039 Hypothyroidism, unspecified: Secondary | ICD-10-CM | POA: Diagnosis not present

## 2022-01-28 DIAGNOSIS — E782 Mixed hyperlipidemia: Secondary | ICD-10-CM | POA: Diagnosis not present

## 2022-01-28 LAB — LIPID PANEL
Cholesterol: 201 mg/dL — ABNORMAL HIGH (ref 0–200)
HDL: 73.9 mg/dL (ref >39.00)
LDL Cholesterol: 105 mg/dL — ABNORMAL HIGH (ref 0–99)
NonHDL: 126.66
Total CHOL/HDL Ratio: 3
Triglycerides: 106 mg/dL (ref 0.0–149.0)
VLDL: 21.2 mg/dL (ref 0.0–40.0)

## 2022-01-28 LAB — COMPREHENSIVE METABOLIC PANEL
ALT: 15 U/L (ref 0–35)
AST: 22 U/L (ref 0–37)
Albumin: 4.4 g/dL (ref 3.5–5.2)
Alkaline Phosphatase: 57 U/L (ref 39–117)
BUN: 19 mg/dL (ref 6–23)
CO2: 28 mEq/L (ref 19–32)
Calcium: 9.4 mg/dL (ref 8.4–10.5)
Chloride: 105 mEq/L (ref 96–112)
Creatinine, Ser: 0.78 mg/dL (ref 0.40–1.20)
GFR: 78.36 mL/min (ref >60.00)
Glucose, Bld: 88 mg/dL (ref 70–99)
Potassium: 4 mEq/L (ref 3.5–5.1)
Sodium: 140 mEq/L (ref 135–145)
Total Bilirubin: 1 mg/dL (ref 0.2–1.2)
Total Protein: 6.7 g/dL (ref 6.0–8.3)

## 2022-01-28 LAB — TSH: TSH: 0.33 u[IU]/mL — ABNORMAL LOW (ref 0.35–5.50)

## 2022-01-28 LAB — HEMOGLOBIN A1C: Hgb A1c MFr Bld: 5.8 % (ref 4.6–6.5)

## 2022-01-28 NOTE — Patient Instructions (Signed)
Constipation   Constipation is a common issue. Often it is related to diet and occasionally medications.    What you can do to treat your symptoms 1) Fiber -- Eat more fiber rich foods: beans, broccoli, berries, avocados, popcorn, pear/apple, green peas, turnip greens, brussels sprouts, whole grains (barley, bran, quinoa, oatmeal) -- Take a Fiber supplement: Psyllium (Metamucil)  -- Could also eat Prunes daily  2) Hydration  -- Drink more water: Try to drink 64 oz of water per Berry  3) Exercise -- Moderate exercise (walking, jogging, biking) for 30 minutes, 5 days a week  4) Dedicate time for Bowel movements - do not delay  5) Stool Softener  - Docusate Sodium (Colace) 100 mg daily or twice daily as needed   If 4-6 weeks have passed and the above has not helped then start the following 6) Laxatives -- Polyethylene Glycol (Miralax) - begin with once daily. After a few days can increase to twice daily Or -- Magnesium Citrate -- Common side effect is nausea and diarrhea -- can try if still not improved   Treating chronic constipation is often about finding the right amount of medication and fiber to keep you regular and comfortable. For some people that may be daily metamucil and colace every other [place]. For others it may be Metamucil and colace twice daily and Miralax 3 times a week. The goal is to go slow and listen to your body. And normal can be anywhere from 2-3 soft bowel movements a [place] to 1 bowel movement every 2-3 days.   

## 2022-01-28 NOTE — Progress Notes (Signed)
Annual Exam   Chief Complaint:  Chief Complaint  Patient presents with   Annual Exam    History of Present Illness:  Ms. Victoria Randall is a 66 y.o. G2P2 who LMP was No LMP recorded. Patient is postmenopausal., presents today for her annual examination.     Nutrition She does get adequate calcium and Vitamin D in her diet. Diet: trying to be healthy, better than it was Exercise: [employer] - 3 boot camps and a weight class weekly    Social History   Tobacco Use  Smoking Status Former   Packs/Koerber: 0.25   Years: 10.00   Pack years: 2.50   Types: Cigarettes   Quit date: 08/22/1985   Years since quitting: 36.4  Smokeless Tobacco Never   Social History   Substance and Sexual Activity  Alcohol Use Yes   Comment: social   Social History   Substance and Sexual Activity  Drug Use No     General Health Dentist in the last year: Yes Eye doctor: yes  Safety The patient wears seatbelts: yes.     The patient feels safe at home and in their relationships: yes.   Menstrual:  Symptoms of menopause: done  GYN She is not sexually active.    Cervical Cancer Screening (21-65):   Last Pap: 06/2018  Breast Cancer Screening (Age 67-74):  There is no FH of breast cancer. There is no FH of ovarian cancer. BRCA screening Not Indicated.  Last Mammogram: 09/2021 The patient does want a mammogram this year.    Colon Cancer Screening:  Age 49-75 yo - benefits outweigh the risk. Adults 52-85 yo who have never been screened benefit.  Benefits: 134000 people in 2016 will be diagnosed and 49,000 will die - early detection helps Harms: Complications 2/2 to colonoscopy High Risk (Colonoscopy): genetic disorder (Lynch syndrome or familial adenomatous polyposis), personal hx of IBD, previous adenomatous polyp, or previous colorectal cancer, FamHx start 10 years before the age at diagnosis, increased in males and black race  Options:  FIT - looks for hemoglobin (blood in the stool) -  specific and fairly sensitive - must be done annually Cologuard - looks for DNA and blood - more sensitive - therefore can have more false positives, every 3 years Colonoscopy - every 10 years if normal - sedation, bowl prep, must have someone drive you  Shared decision making and the patient had decided to do colonoscopy - due 08/2022.   Social History   Tobacco Use  Smoking Status Former   Packs/Henne: 0.25   Years: 10.00   Pack years: 2.50   Types: Cigarettes   Quit date: 08/22/1985   Years since quitting: 36.4  Smokeless Tobacco Never    Lung Cancer Screening (Ages 89-21): not applicable   Weight Wt Readings from Last 3 Encounters:  01/28/22 160 lb 8 oz (72.8 kg)  08/08/21 166 lb (75.3 kg)  12/21/20 166 lb 8 oz (75.5 kg)   Patient has high BMI  BMI Readings from Last 1 Encounters:  01/28/22 27.99 kg/m     Chronic disease screening Blood pressure monitoring:  BP Readings from Last 3 Encounters:  01/28/22 124/82  08/08/21 125/74  12/21/20 120/82    Lipid Monitoring: Indication for screening: age >64, obesity, diabetes, family hx, CV risk factors.  Lipid screening: Yes  Lab Results  Component Value Date   CHOL 214 (H) 12/21/2020   HDL 74.30 12/21/2020   LDLCALC 119 (H) 12/21/2020   TRIG 103.0 12/21/2020  CHOLHDL 3 12/21/2020     Diabetes Screening: age >17, overweight, family hx, PCOS, hx of gestational diabetes, at risk ethnicity Diabetes Screening screening: Yes  Lab Results  Component Value Date   HGBA1C 6.0 12/21/2020     Past Medical History:  Diagnosis Date   Arthritis    knees   Chondromalacia of knee, left    Gallstones    GERD (gastroesophageal reflux disease)    hx of   Hypothyroidism     Past Surgical History:  Procedure Laterality Date   CHOLECYSTECTOMY     COLONOSCOPY WITH PROPOFOL N/A 08/27/2017   Procedure: COLONOSCOPY WITH PROPOFOL;  Surgeon: Ileana Roup, MD;  Location: [facility];  Service: General;  Laterality:  N/A;   HERNIA REPAIR     left knee meniscus     ORTHOPEDIC SURGERY Right    knee   SPIGELIAN HERNIA  [id number]   UMBILICAL HERNIA REPAIR N/A 08/29/2017   Procedure: DIAGNOSTIC LAPAROSCOPY PRIMARY REPAIR OF LEFT SPIGELIAN HERNIA  AND PRIMARY REPAIR OF UMBILICAL HERNIA;  Surgeon: Armandina Gemma, MD;  Location: [facility];  Service: General;  Laterality: N/A;   UMBILICAL HERNIA REPAIR      Prior to Admission medications   Medication Sig Start Date End Date Taking? Authorizing Provider  levothyroxine (SYNTHROID) 112 MCG tablet Take 1 tablet (112 mcg total) by mouth daily. 08/29/21  Yes Lesleigh Noe, MD    No Known Allergies  Gynecologic History: No LMP recorded. Patient is postmenopausal.  Obstetric History: G2P2  Social History   Socioeconomic History   Marital status: Divorced    Spouse name: Not on file   Number of children: 2   Years of education: Bachelors degree   Highest education level: Not on file  Occupational History   Occupation: Dance movement psychotherapist  Tobacco Use   Smoking status: Former    Packs/Schoeppner: 0.25    Years: 10.00    Pack years: 2.50    Types: Cigarettes    Quit date: 08/22/1985    Years since quitting: 36.4   Smokeless tobacco: Never  Vaping Use   Vaping Use: Never used  Substance and Sexual Activity   Alcohol use: Yes    Comment: social   Drug use: No   Sexual activity: Yes    Birth control/protection: Post-menopausal  Other Topics Concern   Not on file  Social History Narrative   09/16/19   From: [place] originally, but lived in [place] for 25 years   Living: alone   Work: Human resources officer for [employer]       Family: 2 children - Journalist, newspaper (Family Medicine Resident, in [place]) and Rodman Key - has 2 children (in [place])      Enjoys: exercise at [employer], visit children, traveling      Exercise: 3-4 times a week   Diet: could be better, overall pretty good, sweet tooth      Safety   Seat belts: Yes    Guns: No   Safe in relationships: Yes    Social  Determinants of Radio broadcast assistant Strain: Not on file  Food Insecurity: Not on file  Transportation Needs: Not on file  Physical Activity: Not on file  Stress: Not on file  Social Connections: Not on file  Intimate Partner Violence: Not on file    Family History  Problem Relation Age of Onset   Other Mother        squamous cell tumor, metabolic acidosis   Squamous cell carcinoma  Mother    Lung cancer Father    Cancer - Lung Father    Heart disease Brother        bypass surgery in his early 32s   Stroke Paternal Grandfather    Heart attack Maternal Aunt        massive, in her 31s   Breast cancer Neg Hx    Colon cancer Neg Hx     Review of Systems  Constitutional:  Negative for chills and fever.  HENT:  Negative for congestion and sore throat.   Eyes:  Negative for blurred vision and double vision.  Respiratory:  Negative for shortness of breath.   Cardiovascular:  Negative for chest pain.  Gastrointestinal:  Negative for heartburn, nausea and vomiting.  Genitourinary: Negative.   Musculoskeletal: Negative.  Negative for myalgias.  Skin:  Negative for rash.  Neurological:  Negative for dizziness and headaches.  Endo/Heme/Allergies:  Does not bruise/bleed easily.  Psychiatric/Behavioral:  Negative for depression. The patient is not nervous/anxious.     Physical Exam BP 124/82   Pulse 66   Temp (!) 97.5 F (36.4 C) (Temporal)   Ht 5' 3.5" (1.613 m)   Wt 160 lb 8 oz (72.8 kg)   SpO2 96%   BMI 27.99 kg/m    BP Readings from Last 3 Encounters:  01/28/22 124/82  08/08/21 125/74  12/21/20 120/82      Physical Exam Constitutional:      General: She is not in acute distress.    Appearance: She is well-developed. She is not diaphoretic.  HENT:     Head: Normocephalic and atraumatic.     Right Ear: External ear normal.     Left Ear: External ear normal.     Nose: Nose normal.  Eyes:     General: No scleral icterus.    Extraocular Movements:  Extraocular movements intact.     Conjunctiva/sclera: Conjunctivae normal.  Cardiovascular:     Rate and Rhythm: Normal rate and regular rhythm.     Heart sounds: No murmur heard. Pulmonary:     Effort: Pulmonary effort is normal. No respiratory distress.     Breath sounds: Normal breath sounds. No wheezing.  Abdominal:     General: Bowel sounds are normal. There is no distension.     Palpations: Abdomen is soft. There is no mass.     Tenderness: There is no abdominal tenderness. There is no guarding or rebound.  Musculoskeletal:        General: Normal range of motion.     Cervical back: Neck supple.  Lymphadenopathy:     Cervical: No cervical adenopathy.  Skin:    General: Skin is warm and dry.     Capillary Refill: Capillary refill takes less than 2 seconds.  Neurological:     Mental Status: She is alert and oriented to person, place, and time.     Deep Tendon Reflexes: Reflexes normal.  Psychiatric:        Mood and Affect: Mood normal.        Behavior: Behavior normal.    Results:  PHQ-9:  [place] Office Visit from 08/21/2018 in [facility]  PHQ-9 Total Score 0         Assessment: 66 y.o. G2P2 female here for routine annual physical examination.  Plan: Problem List Items Addressed This Visit       Endocrine   Hypothyroidism   Relevant Orders   TSH     Other   Prediabetes  Relevant Orders   Hemoglobin A1c   Comprehensive metabolic panel   Hyperlipidemia   Relevant Orders   Lipid panel   Comprehensive metabolic panel   Other Visit Diagnoses     Annual physical exam    -  Primary       Screening: -- Blood pressure screen normal -- cholesterol screening: will obtain -- Weight screening: normal -- Diabetes Screening: will obtain -- Nutrition: Encouraged healthy diet  The 10-year ASCVD risk score (Arnett DK, et al., 2019) is: 6.1%   Values used to calculate the score:     Age: 31 years     Sex: Female     Is  Non-Hispanic African American: No     Diabetic: No     Tobacco smoker: No     Systolic Blood Pressure: 750 mmHg     Is BP treated: No     HDL Cholesterol: 74.3 mg/dL     Total Cholesterol: 214 mg/dL  -- Statin therapy for Age 1-75 with CVD risk >7.5%  Psych -- Depression screening (PHQ-9):  [place] Office Visit from 08/21/2018 in [facility]  PHQ-9 Total Score 0        Safety -- tobacco screening: not using -- alcohol screening:  low-risk usage. -- no evidence of domestic violence or intimate partner violence.   Cancer Screening -- pap smear not collected per ASCCP guidelines -- family history of breast cancer screening: done. not at high risk. -- Mammogram -  up to date -- Colon cancer (age 61+)--  due next jan - she will update with preference for GI  Immunizations Immunization History  Administered Date(s) Administered   Fluad Quad(high Dose 65+) 06/28/2021   Influenza, Seasonal, Injecte, Preservative Fre 05/31/2019   Influenza,inj,Quad PF,6+ Mos 06/22/2020   PFIZER(Purple Top)SARS-COV-2 Vaccination 10/13/2019, 11/05/2019, 06/15/2020, 02/28/2021   Pneumococcal Conjugate-13 08/15/2020   Pneumococcal Polysaccharide-23 06/28/2021   Tdap 07/09/2018   Zoster Recombinat (Shingrix) 08/15/2020, 12/21/2020    -- flu vaccine up to date -- TDAP q10 years up to date -- Shingles (age >83) up to date -- PPSV-23 (19-64 with chronic disease or smoking) up to date -- PCV-13 (age >63) - one dose followed by PPSV-23 1 year later up to date -- Covid-19 Vaccine up to date   Encouraged healthy diet and exercise. Encouraged regular vision and dental care.    Lesleigh Noe, MD

## 2022-01-29 ENCOUNTER — Other Ambulatory Visit: Payer: Self-pay | Admitting: Family Medicine

## 2022-02-28 ENCOUNTER — Other Ambulatory Visit: Payer: Self-pay | Admitting: Family Medicine

## 2022-02-28 DIAGNOSIS — E039 Hypothyroidism, unspecified: Secondary | ICD-10-CM

## 2022-02-28 NOTE — Telephone Encounter (Signed)
Left message on VM per DPR that she needs to call and schedule a nonfasting lab in 2 weeks per her labs in June. I sent the levothyroxine for 30 days in case there is a change in dosage.

## 2022-03-14 ENCOUNTER — Other Ambulatory Visit (INDEPENDENT_AMBULATORY_CARE_PROVIDER_SITE_OTHER): Payer: 59

## 2022-03-14 ENCOUNTER — Other Ambulatory Visit: Payer: Self-pay | Admitting: Family Medicine

## 2022-03-14 DIAGNOSIS — E039 Hypothyroidism, unspecified: Secondary | ICD-10-CM | POA: Diagnosis not present

## 2022-03-14 LAB — TSH: TSH: 0.39 u[IU]/mL (ref 0.35–5.50)

## 2022-03-19 ENCOUNTER — Other Ambulatory Visit: Payer: Self-pay | Admitting: Family Medicine

## 2022-03-19 DIAGNOSIS — E039 Hypothyroidism, unspecified: Secondary | ICD-10-CM

## 2022-03-20 MED ORDER — LEVOTHYROXINE SODIUM 112 MCG PO TABS
112.0000 ug | ORAL_TABLET | Freq: Every day | ORAL | 3 refills | Status: DC
Start: 2022-03-20 — End: 2023-03-25

## 2022-10-08 ENCOUNTER — Ambulatory Visit (INDEPENDENT_AMBULATORY_CARE_PROVIDER_SITE_OTHER): Payer: 59 | Admitting: Podiatry

## 2022-10-08 ENCOUNTER — Encounter: Payer: Self-pay | Admitting: Internal Medicine

## 2022-10-08 ENCOUNTER — Ambulatory Visit (INDEPENDENT_AMBULATORY_CARE_PROVIDER_SITE_OTHER): Payer: 59 | Admitting: Internal Medicine

## 2022-10-08 VITALS — BP 131/62

## 2022-10-08 VITALS — BP 114/74 | HR 80 | Temp 97.4°F | Ht 63.5 in | Wt 159.0 lb

## 2022-10-08 DIAGNOSIS — E039 Hypothyroidism, unspecified: Secondary | ICD-10-CM

## 2022-10-08 DIAGNOSIS — M2011 Hallux valgus (acquired), right foot: Secondary | ICD-10-CM | POA: Diagnosis not present

## 2022-10-08 DIAGNOSIS — M25562 Pain in left knee: Secondary | ICD-10-CM | POA: Diagnosis not present

## 2022-10-08 DIAGNOSIS — M2012 Hallux valgus (acquired), left foot: Secondary | ICD-10-CM

## 2022-10-08 NOTE — Progress Notes (Signed)
Subjective:    Patient ID: Victoria Randall, female    DOB: 27-Jun-1956, 67 y.o.   MRN: [id number]  HPI Here for transfer of care  Wonders about repeating colonoscopy Reviewed the 2019 colonoscopy---all normal biopsies and no adenomatous polyps (hyperplastic) Discussed that she doesn't need it until 2019  Had some trouble with her left knee Brief break due to this--feels something popping since "overdoing it" at boot camp (like squats)  Will be retiring---trying to decide what is best for Medicare Part B  Only medication is levothyroxine  Current Outpatient Medications on File Prior to Visit  Medication Sig Dispense Refill   levothyroxine (SYNTHROID) 112 MCG tablet Take 1 tablet (112 mcg total) by mouth daily. 90 tablet 3   No current facility-administered medications on file prior to visit.    No Known Allergies  Past Medical History:  Diagnosis Date   Arthritis    knees   Chondromalacia of knee, left    Gallstones    GERD (gastroesophageal reflux disease)    hx of   Hypothyroidism     Past Surgical History:  Procedure Laterality Date   CHOLECYSTECTOMY     COLONOSCOPY WITH PROPOFOL N/A 08/27/2017   Procedure: COLONOSCOPY WITH PROPOFOL;  Surgeon: Ileana Roup, MD;  Location: [facility];  Service: General;  Laterality: N/A;   HERNIA REPAIR     left knee meniscus     ORTHOPEDIC SURGERY Right    knee   SPIGELIAN HERNIA  0000000   UMBILICAL HERNIA REPAIR N/A 08/29/2017   Procedure: DIAGNOSTIC LAPAROSCOPY PRIMARY REPAIR OF LEFT SPIGELIAN HERNIA  AND PRIMARY REPAIR OF UMBILICAL HERNIA;  Surgeon: Armandina Gemma, MD;  Location: [facility];  Service: General;  Laterality: N/A;   UMBILICAL HERNIA REPAIR      Family History  Problem Relation Age of Onset   Other Mother        squamous cell tumor, metabolic acidosis   Squamous cell carcinoma Mother    Lung cancer Father    Cancer - Lung Father    Heart disease Brother        bypass surgery in his early 3s   Stroke  Paternal Grandfather    Heart attack Maternal Aunt        massive, in her 75s   Breast cancer Neg Hx    Colon cancer Neg Hx     Social History   Socioeconomic History   Marital status: Divorced    Spouse name: Not on file   Number of children: 2   Years of education: Bachelors degree   Highest education level: Not on file  Occupational History   Occupation: Dance movement psychotherapist  Tobacco Use   Smoking status: Former    Packs/Worden: 0.25    Years: 10.00    Total pack years: 2.50    Types: Cigarettes    Quit date: 08/22/1985    Years since quitting: 37.1    Passive exposure: Past   Smokeless tobacco: Never  Vaping Use   Vaping Use: Never used  Substance and Sexual Activity   Alcohol use: Yes    Comment: social   Drug use: No   Sexual activity: Yes    Birth control/protection: Post-menopausal  Other Topics Concern   Not on file  Social History Narrative   09/16/19   From: [place] originally, but lived in [place] for 25 years   Living: alone   Work: Human resources officer for [employer]       Family: 2 children -  Sarah (Physician in [place]) and Rodman Key - has 2 children (in [place])      Enjoys: exercise at the [facility], visit children, travel      Safety   Seat belts: Yes    Guns: No   Safe in relationships: Yes    Social Determinants of Radio broadcast assistant Strain: Not on file  Food Insecurity: Not on file  Transportation Needs: Not on file  Physical Activity: Not on file  Stress: Not on file  Social Connections: Not on file  Intimate Partner Violence: Not on file   Review of Systems Sleeps fine Careful with eating     Objective:   Physical Exam Constitutional:      Appearance: Normal appearance.  Musculoskeletal:     Comments: No knee swelling No meniscus or ligament findings Patella seems to track okay  Neurological:     Mental Status: She is alert.     Comments: Normal gait and leg strength            Assessment & Plan:

## 2022-10-08 NOTE — Progress Notes (Signed)
   Chief Complaint  Patient presents with   Callouses    Bilateral callus on the 2nd toes,     Subjective: 67 year old female presenting today as a reestablish new patient for evaluation of symptomatic calluses to the medial aspect of the second digits bilateral secondary to her bunions.  Patient states that the calluses were very painful however over the past few days they did follow-up and she no longer has any pain or tenderness.  She presents for further treatment evaluation   Past Medical History:  Diagnosis Date   Arthritis    knees   Chondromalacia of knee, left    Gallstones    GERD (gastroesophageal reflux disease)    hx of   Hypothyroidism    Past Surgical History:  Procedure Laterality Date   CHOLECYSTECTOMY     COLONOSCOPY WITH PROPOFOL N/A 08/27/2017   Procedure: COLONOSCOPY WITH PROPOFOL;  Surgeon: Ileana Roup, MD;  Location: [facility];  Service: General;  Laterality: N/A;   HERNIA REPAIR     left knee meniscus     ORTHOPEDIC SURGERY Right    knee   SPIGELIAN HERNIA  [id number]   UMBILICAL HERNIA REPAIR N/A 08/29/2017   Procedure: DIAGNOSTIC LAPAROSCOPY PRIMARY REPAIR OF LEFT SPIGELIAN HERNIA  AND PRIMARY REPAIR OF UMBILICAL HERNIA;  Surgeon: Armandina Gemma, MD;  Location: [facility];  Service: General;  Laterality: N/A;   UMBILICAL HERNIA REPAIR     No Known Allergies    Objective: Physical Exam General: The patient is alert and oriented x3 in no acute distress.  Dermatology: Skin is cool, dry and supple bilateral lower extremities. Negative for open lesions or macerations.  Callus formation noted to the medial aspect of the PIPJ second digit bilateral  Vascular: Palpable pedal pulses bilaterally. No edema or erythema noted. Capillary refill within normal limits.  Neurological: Epicritic and protective threshold grossly intact bilaterally.   Musculoskeletal Exam: Clinical evidence of bunion deformity noted to the respective foot. There is moderate pain on  palpation range of motion of the first MPJ. Lateral deviation of the hallux noted consistent with hallux abductovalgus.  Radiographic Exam B/L feet 09/15/2018: Increased intermetatarsal angle greater than 15 with a hallux abductus angle greater than 30 noted on AP view. Moderate degenerative changes noted within the first MPJ.  Assessment: 1. HAV w/ bunion deformity bilateral  2.  Symptomatic corn/calluses second digits bilateral   Plan of Care:  1. Patient was evaluated. X-Rays reviewed. 2. Today we discussed the conservative versus surgical management of bunion deformity. We will try conservative treatment at this time.  3.  Recommended good shoe gear.  4. Continue taking Naproxen as needed.  5.  Currently the calluses to the medial aspect of the second digits fell off and there is no pain associated to the callus formations.   Edrick Kins, DPM [facility]  Dr. Edrick Kins, DPM    [address], [place] [address]                Office [phone]  Fax [phone]

## 2022-10-08 NOTE — Assessment & Plan Note (Signed)
Probably just strain from overdoing it with lunges, etc Improved now No action needed

## 2022-10-08 NOTE — Assessment & Plan Note (Signed)
Last TSH normal on the levothyroxine

## 2022-10-09 ENCOUNTER — Encounter: Payer: Self-pay | Admitting: Family Medicine

## 2022-10-09 ENCOUNTER — Ambulatory Visit (INDEPENDENT_AMBULATORY_CARE_PROVIDER_SITE_OTHER): Payer: 59 | Admitting: Family Medicine

## 2022-10-09 VITALS — BP 122/82 | HR 76 | Wt 161.0 lb

## 2022-10-09 DIAGNOSIS — Z1231 Encounter for screening mammogram for malignant neoplasm of breast: Secondary | ICD-10-CM

## 2022-10-09 DIAGNOSIS — Z01419 Encounter for gynecological examination (general) (routine) without abnormal findings: Secondary | ICD-10-CM

## 2022-10-09 DIAGNOSIS — Z1211 Encounter for screening for malignant neoplasm of colon: Secondary | ICD-10-CM

## 2022-10-09 NOTE — Progress Notes (Signed)
Subjective:     Victoria Randall is a 67 y.o. female and is here for a comprehensive physical exam. The patient reports no problems.  The following portions of the patient's history were reviewed and updated as appropriate: allergies, current medications, past family history, past medical history, past social history, past surgical history, and problem list.  Review of Systems Pertinent items noted in HPI and remainder of comprehensive ROS otherwise negative.   Objective:    BP 122/82   Pulse 76   Wt 161 lb (73 kg)   BMI 28.07 kg/m  General appearance: alert, cooperative, and appears stated age Head: Normocephalic, without obvious abnormality, atraumatic Neck: supple, symmetrical, trachea midline Lungs: clear to auscultation bilaterally Heart: regular rate and rhythm, S1, S2 normal, no murmur, click, rub or gallop Abdomen: soft, non-tender; bowel sounds normal; no masses,  no organomegaly Extremities: extremities normal, atraumatic, no cyanosis or edema Skin: Skin color, texture, turgor normal. No rashes or lesions Neurologic: Grossly normal    Assessment:    Healthy female exam.      Plan:  Encounter for gynecological examination without abnormal finding - does not need pap due to age  Encounter for screening mammogram for malignant neoplasm of breast - screen for breast CA - Plan: MM 3D SCREEN BREAST BILATERAL  Encounter for screening colonoscopy - per last GI note, f/u in 5 years-she would like to schedule. - Plan: Ambulatory referral to Gastroenterology  Return in 1 year (on 10/10/2023).     See After Visit Summary for Counseling Recommendations

## 2022-10-09 NOTE — Progress Notes (Signed)
CC: Pt stating that she is here due to needing another mammogram  Denies concerns vaginally or regarding to her breasts

## 2022-10-25 ENCOUNTER — Ambulatory Visit
Admission: RE | Admit: 2022-10-25 | Discharge: 2022-10-25 | Disposition: A | Discharge status: Home / Self Care | Payer: 59 | Source: Ambulatory Visit | Attending: Family Medicine | Admitting: Family Medicine

## 2022-10-25 DIAGNOSIS — Z1231 Encounter for screening mammogram for malignant neoplasm of breast: Secondary | ICD-10-CM | POA: Insufficient documentation

## 2023-03-25 ENCOUNTER — Telehealth: Payer: Self-pay

## 2023-03-25 DIAGNOSIS — E039 Hypothyroidism, unspecified: Secondary | ICD-10-CM

## 2023-03-25 MED ORDER — LEVOTHYROXINE SODIUM 112 MCG PO TABS
112.0000 ug | ORAL_TABLET | Freq: Every day | ORAL | 0 refills | Status: DC
Start: 2023-03-25 — End: 2023-04-22

## 2023-03-25 NOTE — Telephone Encounter (Signed)
Rx sent electronically.  

## 2023-03-26 ENCOUNTER — Encounter (INDEPENDENT_AMBULATORY_CARE_PROVIDER_SITE_OTHER): Payer: Self-pay

## 2023-04-16 ENCOUNTER — Encounter: Payer: Self-pay | Admitting: Internal Medicine

## 2023-04-16 ENCOUNTER — Ambulatory Visit (INDEPENDENT_AMBULATORY_CARE_PROVIDER_SITE_OTHER): Payer: Medicare HMO | Admitting: Internal Medicine

## 2023-04-16 VITALS — BP 122/84 | HR 78 | Temp 97.4°F | Ht 63.5 in | Wt 159.0 lb

## 2023-04-16 DIAGNOSIS — Z0000 Encounter for general adult medical examination without abnormal findings: Secondary | ICD-10-CM | POA: Insufficient documentation

## 2023-04-16 DIAGNOSIS — M8580 Other specified disorders of bone density and structure, unspecified site: Secondary | ICD-10-CM | POA: Diagnosis not present

## 2023-04-16 DIAGNOSIS — E785 Hyperlipidemia, unspecified: Secondary | ICD-10-CM

## 2023-04-16 DIAGNOSIS — E039 Hypothyroidism, unspecified: Secondary | ICD-10-CM | POA: Diagnosis not present

## 2023-04-16 LAB — LIPID PANEL
Cholesterol: 228 mg/dL — ABNORMAL HIGH (ref 0–200)
HDL: 72.1 mg/dL (ref >39.00)
LDL Cholesterol: 139 mg/dL — ABNORMAL HIGH (ref 0–99)
NonHDL: 155.45
Total CHOL/HDL Ratio: 3
Triglycerides: 81 mg/dL (ref 0.0–149.0)
VLDL: 16.2 mg/dL (ref 0.0–40.0)

## 2023-04-16 LAB — COMPREHENSIVE METABOLIC PANEL
ALT: 17 U/L (ref 0–35)
AST: 18 U/L (ref 0–37)
Albumin: 4.4 g/dL (ref 3.5–5.2)
Alkaline Phosphatase: 59 U/L (ref 39–117)
BUN: 24 mg/dL — ABNORMAL HIGH (ref 6–23)
CO2: 28 meq/L (ref 19–32)
Calcium: 9.4 mg/dL (ref 8.4–10.5)
Chloride: 105 meq/L (ref 96–112)
Creatinine, Ser: 0.74 mg/dL (ref 0.40–1.20)
GFR: 82.77 mL/min (ref >60.00)
Glucose, Bld: 104 mg/dL — ABNORMAL HIGH (ref 70–99)
Potassium: 4.4 mEq/L (ref 3.5–5.1)
Sodium: 140 meq/L (ref 135–145)
Total Bilirubin: 0.6 mg/dL (ref 0.2–1.2)
Total Protein: 6.6 g/dL (ref 6.0–8.3)

## 2023-04-16 LAB — CBC
HCT: 41 % (ref 36.0–46.0)
Hemoglobin: 13.5 g/dL (ref 12.0–15.0)
MCHC: 32.8 g/dL (ref 30.0–36.0)
MCV: 87.6 fl (ref 78.0–100.0)
Platelets: 190 10*3/uL (ref 150.0–400.0)
RBC: 4.68 Mil/uL (ref 3.87–5.11)
RDW: 14.4 % (ref 11.5–15.5)
WBC: 4.9 10*3/uL (ref 4.0–10.5)

## 2023-04-16 NOTE — Assessment & Plan Note (Addendum)
I have personally reviewed the Medicare Annual Wellness questionnaire and have noted 1. The patient's medical and social history 2. Their use of alcohol, tobacco or illicit drugs 3. Their current medications and supplements 4. The patient's functional ability including ADL's, fall risks, home safety risks and hearing or visual             impairment. 5. Diet and physical activities 6. Evidence for depression or mood disorders  The patients weight, height, BMI and visual acuity have been recorded in the chart I have made referrals, counseling and provided education to the patient based review of the above and I have provided the pt with a written personalized care plan for preventive services.  I have provided you with a copy of your personalized plan for preventive services. Please take the time to review along with your updated medication list.  Colon due again in 2029 Mammogram every 1-2 years till 1 (or maybe 52) Can probably stop paps---sees gyn Exercises regularly Update COVID/flu vaccines this fall One time RSV

## 2023-04-16 NOTE — Assessment & Plan Note (Signed)
Seems to be euthyroid on levothyroxine 112 mcg Will check labs

## 2023-04-16 NOTE — Progress Notes (Signed)
Subjective:    Patient ID: Victoria Randall, female    DOB: Jan 17, 1956, 67 y.o.   MRN: [id number]  HPI Here for Welcome to Medicare visit and follow up of chronic health conditions Reviewed advanced directives Reviewed other doctors---Dr Pratt--gyn, Dr Ferdie Ping, Dr Randel Pigg, LTR dental, Dr Zada Girt No hospitalizations or surgery in the past year [facility]---and walks Vision is okay Hearing is okay Wine 3 nights per week generally No tobacco in a long time No falls No depression or anhedonia Independent with instrumental ADLs No sig memory issues  Retired--but plans to start working 20 hours per week--remotely Still computer programmer  Taking baby aspirin daily Wonders if this is appropriate No chest pain or SOB No dizziness or syncope Improved exercise tolerance No palpitations Discussed stopping  Thinks thyroid is okay Trouble adjusting dose--on generic now No fatigue, weight is stable.  Some issues with hair. Nails are weak--nothing new  DEXA showed mild osteopenia On vitamin D  Current Outpatient Medications on File Prior to Visit  Medication Sig Dispense Refill   aspirin 81 MG chewable tablet Chew 81 mg by mouth daily.     Cholecalciferol (VITAMIN D3 GUMMIES ADULT PO) Take by mouth.     levothyroxine (SYNTHROID) 112 MCG tablet Take 1 tablet (112 mcg total) by mouth daily. 90 tablet 0   Multiple Vitamin (MULTIVITAMIN ADULT PO) Take by mouth.     No current facility-administered medications on file prior to visit.    No Known Allergies  Past Medical History:  Diagnosis Date   Arthritis    knees   Chondromalacia of knee, left    Gallstones    GERD (gastroesophageal reflux disease)    hx of   Hypothyroidism     Past Surgical History:  Procedure Laterality Date   CHOLECYSTECTOMY     COLONOSCOPY WITH PROPOFOL N/A 08/27/2017   Procedure: COLONOSCOPY WITH PROPOFOL;  Surgeon: Andria Meuse, MD;   Location: [facility];  Service: General;  Laterality: N/A;   HERNIA REPAIR     left knee meniscus     ORTHOPEDIC SURGERY Right    knee   SPIGELIAN HERNIA  08/2017   UMBILICAL HERNIA REPAIR N/A 08/29/2017   Procedure: DIAGNOSTIC LAPAROSCOPY PRIMARY REPAIR OF LEFT SPIGELIAN HERNIA  AND PRIMARY REPAIR OF UMBILICAL HERNIA;  Surgeon: Darnell Level, MD;  Location: [facility];  Service: General;  Laterality: N/A;   UMBILICAL HERNIA REPAIR      Family History  Problem Relation Age of Onset   Other Mother        squamous cell tumor, metabolic acidosis   Squamous cell carcinoma Mother    Lung cancer Father    Cancer - Lung Father    Heart disease Brother        bypass surgery in his early 21s   Stroke Paternal Grandfather    Heart attack Maternal Aunt        massive, in her 82s   Breast cancer Neg Hx    Colon cancer Neg Hx     Social History   Socioeconomic History   Marital status: Divorced    Spouse name: Not on file   Number of children: 2   Years of education: Bachelors degree   Highest education level: Not on file  Occupational History   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  Tobacco Use   Smoking status: Former    Current packs/[place]: 0.00  Average packs/[place]: 0.3 packs/[place] for 10.0 years (2.5 ttl pk-yrs)    Types: Cigarettes    Start date: 08/23/1975    Quit date: 08/22/1985    Years since quitting: 37.6    Passive exposure: Past   Smokeless tobacco: Never  Vaping Use   Vaping status: Never Used  Substance and Sexual Activity   Alcohol use: Yes    Comment: social   Drug use: No   Sexual activity: Yes    Birth control/protection: Post-menopausal  Other Topics Concern   Not on file  Social History Narrative   09/16/19   From: [place] originally, but lived in [place] for 25 years   Living: alone   Work: Nutritional therapist for [employer]       Family: 2 children - Doctor, general practice (Physician in [place]) and Molli Hazard - has 2 children  (in [place])      Enjoys: exercise at [employer], visit children, travel      Has living will   Daughter is health care POA   Would accept resuscitation   Would accept tube feeds---but limited          Social Determinants of Corporate investment banker Strain: Not on file  Food Insecurity: Not on file  Transportation Needs: Not on file  Physical Activity: Not on file  Stress: Not on file  Social Connections: Not on file  Intimate Partner Violence: Not on file   Review of Systems Appetite is good Weight is down a few pounds Sleeps okay Wears seat belt Teeth are okay--keeps up with dentist (recent crown) No suspicious skin lesions Rare heartburn---no meds. No dysphagia Bowels move fine---no blood Some increased urinary frequency. No incontinence No sig back or joint pains Bunions--gets corns because of this    Objective:   Physical Exam Constitutional:      Appearance: Normal appearance.  HENT:     Mouth/Throat:     Pharynx: No oropharyngeal exudate or posterior oropharyngeal erythema.  Eyes:     Conjunctiva/sclera: Conjunctivae normal.     Pupils: Pupils are equal, round, and reactive to light.  Cardiovascular:     Rate and Rhythm: Normal rate and regular rhythm.     Pulses: Normal pulses.     Heart sounds: No murmur heard.    No gallop.  Pulmonary:     Effort: Pulmonary effort is normal.     Breath sounds: Normal breath sounds. No wheezing or rales.  Abdominal:     Palpations: Abdomen is soft.     Tenderness: There is no abdominal tenderness.  Musculoskeletal:     Cervical back: Neck supple.     Right lower leg: No edema.     Left lower leg: No edema.  Lymphadenopathy:     Cervical: No cervical adenopathy.  Skin:    Findings: No rash.  Neurological:     General: No focal deficit present.     Mental Status: She is alert and oriented to person, place, and time.     Comments: Word naming---14/1 minute Recall 3/3  Psychiatric:        Mood and Affect:  Mood normal.        Behavior: Behavior normal.            Assessment & Plan:

## 2023-04-16 NOTE — Progress Notes (Signed)
Hearing Screening   500Hz  1000Hz  2000Hz  4000Hz   Right ear 25 25 25 25   Left ear 20 20 20  0   Vision Screening   Right eye Left eye Both eyes  Without correction 20/13 20/25 20/13   With correction

## 2023-04-16 NOTE — Assessment & Plan Note (Signed)
Discussed vitamin D, calcium from diet Regular weight bearing exercise

## 2023-04-16 NOTE — Assessment & Plan Note (Addendum)
Borderline elevated Low risk overall Will hold off on medications for primary prevention Also stop the ASA

## 2023-04-18 ENCOUNTER — Other Ambulatory Visit: Payer: Self-pay | Admitting: Internal Medicine

## 2023-04-18 DIAGNOSIS — E039 Hypothyroidism, unspecified: Secondary | ICD-10-CM

## 2023-04-18 LAB — T4, FREE: Free T4: 1.55 ng/dL (ref 0.60–1.60)

## 2023-04-18 LAB — TSH: TSH: 0.1 u[IU]/mL — ABNORMAL LOW (ref 0.35–5.50)

## 2023-04-22 ENCOUNTER — Telehealth: Payer: Self-pay

## 2023-04-22 MED ORDER — LEVOTHYROXINE SODIUM 88 MCG PO TABS: 88.0000 ug | ORAL_TABLET | Freq: Every day | ORAL | 3 refills | Status: DC

## 2023-04-22 NOTE — Telephone Encounter (Signed)
-----   Message from Tillman Abide sent at 04/18/2023  3:24 PM EDT ----- Results released Needs new Rx for 88 mcg daily (x 1 year) Set up repeat labs in about 6 weeks

## 2023-05-28 DIAGNOSIS — R69 Illness, unspecified: Secondary | ICD-10-CM | POA: Diagnosis not present

## 2023-06-04 ENCOUNTER — Other Ambulatory Visit (INDEPENDENT_AMBULATORY_CARE_PROVIDER_SITE_OTHER): Payer: Medicare HMO

## 2023-06-04 DIAGNOSIS — E039 Hypothyroidism, unspecified: Secondary | ICD-10-CM

## 2023-06-04 LAB — T4, FREE: Free T4: 1.13 ng/dL (ref 0.60–1.60)

## 2023-06-04 LAB — TSH: TSH: 0.48 u[IU]/mL (ref 0.35–5.50)

## 2023-07-01 DIAGNOSIS — R69 Illness, unspecified: Secondary | ICD-10-CM | POA: Diagnosis not present

## 2023-07-13 ENCOUNTER — Other Ambulatory Visit: Payer: Self-pay | Admitting: Internal Medicine

## 2023-07-13 DIAGNOSIS — E039 Hypothyroidism, unspecified: Secondary | ICD-10-CM

## 2023-07-28 DIAGNOSIS — R69 Illness, unspecified: Secondary | ICD-10-CM | POA: Diagnosis not present

## 2023-09-25 ENCOUNTER — Other Ambulatory Visit: Payer: Self-pay | Admitting: Family Medicine

## 2023-09-25 DIAGNOSIS — Z1231 Encounter for screening mammogram for malignant neoplasm of breast: Secondary | ICD-10-CM

## 2023-10-28 ENCOUNTER — Ambulatory Visit
Admission: RE | Admit: 2023-10-28 | Discharge: 2023-10-28 | Disposition: A | Discharge status: Home / Self Care | Payer: Medicare HMO | Source: Ambulatory Visit | Attending: Family Medicine | Admitting: Family Medicine

## 2023-10-28 DIAGNOSIS — Z1231 Encounter for screening mammogram for malignant neoplasm of breast: Secondary | ICD-10-CM | POA: Insufficient documentation

## 2023-10-31 ENCOUNTER — Encounter: Payer: Self-pay | Admitting: Family Medicine

## 2024-04-09 ENCOUNTER — Ambulatory Visit: Payer: Self-pay | Admitting: Podiatry

## 2024-04-09 ENCOUNTER — Encounter: Payer: Self-pay | Admitting: Podiatry

## 2024-04-09 VITALS — Ht 63.5 in | Wt 159.0 lb

## 2024-04-09 DIAGNOSIS — M2012 Hallux valgus (acquired), left foot: Secondary | ICD-10-CM | POA: Diagnosis not present

## 2024-04-09 DIAGNOSIS — M2011 Hallux valgus (acquired), right foot: Secondary | ICD-10-CM | POA: Diagnosis not present

## 2024-04-09 NOTE — Progress Notes (Signed)
   Chief Complaint  Patient presents with   Callouses    Pt is here to have callous removed from bilateral feet in between the 2nd toes.    Subjective: 68 year old female presenting today for recurrence of symptomatic calluses to the medial aspect of the second digits bilateral secondary to her hallux valgus deformity   Past Medical History:  Diagnosis Date   Arthritis    knees   Chondromalacia of knee, left    Gallstones    GERD (gastroesophageal reflux disease)    hx of   Hypothyroidism    Past Surgical History:  Procedure Laterality Date   CHOLECYSTECTOMY     COLONOSCOPY WITH PROPOFOL N/A 08/27/2017   Procedure: COLONOSCOPY WITH PROPOFOL;  Surgeon: Teresa Lonni HERO, MD;  [facility]: [facility];  Service: General;  Laterality: N/A;   HERNIA REPAIR     left knee meniscus     ORTHOPEDIC SURGERY Right    knee   SPIGELIAN HERNIA  08/2017   UMBILICAL HERNIA REPAIR N/A 08/29/2017   Procedure: DIAGNOSTIC LAPAROSCOPY PRIMARY REPAIR OF LEFT SPIGELIAN HERNIA  AND PRIMARY REPAIR OF UMBILICAL HERNIA;  Surgeon: Eletha Boas, MD;  Location: [facility];  Service: General;  Laterality: N/A;   UMBILICAL HERNIA REPAIR     No Known Allergies    Objective: Physical Exam General: The patient is alert and oriented x3 in no acute distress.  Dermatology: Skin is cool, dry and supple bilateral lower extremities. Negative for open lesions or macerations.  Callus formation noted to the medial aspect of the PIPJ second digit bilateral  Vascular: Palpable pedal pulses bilaterally. No edema or erythema noted. Capillary refill within normal limits.  Neurological: Epicritic and protective threshold grossly intact bilaterally.   Musculoskeletal Exam: Clinical evidence of bunion deformity noted to the respective foot. There is moderate pain on palpation range of motion of the first MPJ. Lateral deviation of the hallux noted consistent with hallux abductovalgus.  Radiographic Exam B/L feet 09/15/2018:  Increased intermetatarsal angle greater than 15 with a hallux abductus angle greater than 30 noted on AP view. Moderate degenerative changes noted within the first MPJ.  Assessment: 1. HAV w/ bunion deformity bilateral  2.  Symptomatic corn/calluses second digits bilateral   Plan of Care:  -Patient was evaluated. -Today we discussed the conservative versus surgical management of bunion deformity. We will try conservative treatment at this time.  -Recommended good shoe gear.  -Continue taking Naproxen as needed.  -Currently the calluses to the medial aspect of the second digits fell off and there is no pain associated to the callus formations. -Return to clinic PRN   Thresa EMERSON Sar, DPM [facility]  Dr. Thresa EMERSON Sar, DPM    [address] [place], [place] [address]                Office [phone]  Fax [phone]

## 2024-04-16 ENCOUNTER — Ambulatory Visit: Payer: Self-pay | Admitting: Internal Medicine

## 2024-04-16 ENCOUNTER — Ambulatory Visit (INDEPENDENT_AMBULATORY_CARE_PROVIDER_SITE_OTHER): Admitting: Internal Medicine

## 2024-04-16 ENCOUNTER — Encounter: Payer: Self-pay | Admitting: Internal Medicine

## 2024-04-16 VITALS — BP 122/80 | HR 86 | Temp 98.6°F

## 2024-04-16 DIAGNOSIS — Z0000 Encounter for general adult medical examination without abnormal findings: Secondary | ICD-10-CM

## 2024-04-16 DIAGNOSIS — E785 Hyperlipidemia, unspecified: Secondary | ICD-10-CM | POA: Diagnosis not present

## 2024-04-16 DIAGNOSIS — R194 Change in bowel habit: Secondary | ICD-10-CM | POA: Diagnosis not present

## 2024-04-16 DIAGNOSIS — E039 Hypothyroidism, unspecified: Secondary | ICD-10-CM

## 2024-04-16 LAB — T4, FREE: Free T4: 1.1 ng/dL (ref 0.60–1.60)

## 2024-04-16 LAB — COMPREHENSIVE METABOLIC PANEL WITH GFR
ALT: 15 U/L (ref 0–35)
AST: 19 U/L (ref 0–37)
Albumin: 4.2 g/dL (ref 3.5–5.2)
Alkaline Phosphatase: 56 U/L (ref 39–117)
BUN: 23 mg/dL (ref 6–23)
CO2: 29 meq/L (ref 19–32)
Calcium: 9.1 mg/dL (ref 8.4–10.5)
Chloride: 104 meq/L (ref 96–112)
Creatinine, Ser: 0.77 mg/dL (ref 0.40–1.20)
GFR: 78.36 mL/min (ref >60.00)
Glucose, Bld: 87 mg/dL (ref 70–99)
Potassium: 3.9 meq/L (ref 3.5–5.1)
Sodium: 141 meq/L (ref 135–145)
Total Bilirubin: 0.9 mg/dL (ref 0.2–1.2)
Total Protein: 6.3 g/dL (ref 6.0–8.3)

## 2024-04-16 LAB — CBC
HCT: 39.9 % (ref 36.0–46.0)
Hemoglobin: 13.1 g/dL (ref 12.0–15.0)
MCHC: 32.8 g/dL (ref 30.0–36.0)
MCV: 87 fl (ref 78.0–100.0)
Platelets: 160 K/uL (ref 150.0–400.0)
RBC: 4.58 Mil/uL (ref 3.87–5.11)
RDW: 13.5 % (ref 11.5–15.5)
WBC: 4.3 K/uL (ref 4.0–10.5)

## 2024-04-16 LAB — LIPID PANEL
Cholesterol: 189 mg/dL (ref 0–200)
HDL: 69.1 mg/dL (ref >39.00)
LDL Cholesterol: 104 mg/dL — ABNORMAL HIGH (ref 0–99)
NonHDL: 119.67
Total CHOL/HDL Ratio: 3
Triglycerides: 77 mg/dL (ref 0.0–149.0)
VLDL: 15.4 mg/dL (ref 0.0–40.0)

## 2024-04-16 LAB — TSH: TSH: 0.28 u[IU]/mL — ABNORMAL LOW (ref 0.35–5.50)

## 2024-04-16 MED ORDER — SYNTHROID 88 MCG PO TABS: 88.0000 ug | ORAL_TABLET | Freq: Every day | ORAL | 3 refills | Status: AC

## 2024-04-16 NOTE — Assessment & Plan Note (Signed)
Low risk profile with high HDL

## 2024-04-16 NOTE — Assessment & Plan Note (Signed)
 Healthy Exercises regularly Flu vaccine soon--recommended updated COVID as well Colon not due but will refer due to symptoms Mammogram yearly in March No pap due to age

## 2024-04-16 NOTE — Progress Notes (Signed)
 Subjective:    Patient ID: Victoria Randall, female    DOB: [birth year], 68 y.o.   MRN: [id number]  HPI Here for physical  Doing well Still working part time Still [facility] regularly [facility] the [facility]---loves the social part also  Has some flares with her GI tract Usually when on the road--like seeing her kids Bowels are very irregular---but no blood  No pain  Has had some issues with the generic levothyroxine  Doesn't feel it is as good as brand  Known elevated cholesterol  ?wart in scalp---will go see derm  Current Outpatient Medications on File Prior to Visit  Medication Sig Dispense Refill   Cholecalciferol (VITAMIN D3 GUMMIES ADULT PO) Take by mouth.     levothyroxine  (SYNTHROID ) 88 MCG tablet Take 1 tablet (88 mcg total) by mouth daily. 90 tablet 3   Multiple Vitamin (MULTIVITAMIN ADULT PO) Take by mouth.     No current facility-administered medications on file prior to visit.    No Known Allergies  Past Medical History:  Diagnosis Date   Arthritis    knees   Chondromalacia of knee, left    Gallstones    GERD (gastroesophageal reflux disease)    hx of   Hypothyroidism     Past Surgical History:  Procedure Laterality Date   CHOLECYSTECTOMY     COLONOSCOPY WITH PROPOFOL  N/A 08/27/2017   Procedure: COLONOSCOPY WITH PROPOFOL ;  Surgeon: Teresa Lonni HERO, MD;  Location: [facility];  Service: General;  Laterality: N/A;   HERNIA REPAIR     left knee meniscus     ORTHOPEDIC SURGERY Right    knee   SPIGELIAN HERNIA  08/2017   UMBILICAL HERNIA REPAIR N/A 08/29/2017   Procedure: DIAGNOSTIC LAPAROSCOPY PRIMARY REPAIR OF LEFT SPIGELIAN HERNIA  AND PRIMARY REPAIR OF UMBILICAL HERNIA;  Surgeon: Eletha Boas, MD;  Location: [facility];  Service: General;  Laterality: N/A;   UMBILICAL HERNIA REPAIR      Family History  Problem Relation Age of Onset   Other Mother        squamous cell tumor, metabolic acidosis   Squamous cell carcinoma Mother    Lung cancer Father    Cancer -  Lung Father    Heart disease Brother        bypass surgery in his early 79s   Stroke Paternal Grandfather    Heart attack Maternal Aunt        massive, in her 94s   Breast cancer Neg Hx    Colon cancer Neg Hx     Social History   Socioeconomic History   Marital status: Divorced    Spouse name: Not on file   Number of children: 2   Years of education: Bachelors degree   Highest education level: Bachelor's degree (e.g., BA, AB, BS)  Occupational History   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  Tobacco Use   Smoking status: Former    Current packs/[place]: 0.00    Average packs/[place]: 0.3 packs/[place] for 10.0 years (2.5 ttl pk-yrs)    Types: Cigarettes    Start date: 08/23/1975    Quit date: 08/22/1985    Years since quitting: 38.6    Passive exposure: Past   Smokeless tobacco: Never  Vaping Use   Vaping status: Never Used  Substance and Sexual Activity   Alcohol use: Yes    Comment: social   Drug use: No   Sexual activity: Yes    Birth control/protection: Post-menopausal  Other Topics Concern   Not on file  Social History Narrative   09/16/19   From: [place] originally, but lived in [place] for 25 years   Living: alone   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, visit children, travel      Has living will   Daughter is health care POA   Would accept resuscitation   Would accept tube feeds---but limited          Social Drivers of [employer] Strain: Low Risk  (04/14/2024)   Overall Financial Resource Strain (CARDIA)    Difficulty of Paying Living Expenses: Not hard at all  Food Insecurity: No Food Insecurity (04/14/2024)   Hunger Vital Sign    Worried About Running Out of Food in the Last Year: Never true    Ran Out of Food in the Last Year: Never true  Transportation  Needs: No Transportation Needs (04/14/2024)   PRAPARE - Administrator, Civil Service (Medical): No    Lack of Transportation (Non-Medical): No  Physical Activity: Sufficiently Active (04/14/2024)   Exercise Vital Sign    Days of Exercise per Week: 5 days    Minutes of Exercise per Session: 60 min  Stress: No Stress Concern Present (04/14/2024)   [employer] of Occupational Health - Occupational Stress Questionnaire    Feeling of Stress: Not at all  Social Connections: Moderately Isolated (04/14/2024)   Social Connection and Isolation Panel    Frequency of Communication with Friends and Family: More than three times a week    Frequency of Social Gatherings with Friends and Family: Once a week    Attends Religious Services: More than 4 times per year    Active Member of [employer] or Organizations: No    Attends Engineer, structural: Not on file    Marital Status: Divorced  Intimate Partner Violence: Not on file   Review of Systems  Constitutional:  Negative for fatigue and unexpected weight change.       Wears seat belt  HENT:  Positive for hearing loss and tinnitus. Negative for dental problem and trouble swallowing.        Keeps up with dentist  Eyes:  Negative for visual disturbance.       No diplopia or unilateral vision loss  Respiratory:  Negative for cough, chest tightness and shortness of breath.   Cardiovascular:  Negative for chest pain, palpitations and leg swelling.  Gastrointestinal:  Negative for abdominal pain.       Occ heartburn--no meds  Endocrine: Negative for polydipsia and polyuria.  Genitourinary:  Negative for dysuria and hematuria.       No sexual concerns  Musculoskeletal:  Negative for back pain and joint swelling.       Mild overuse issues at times  Skin:  Negative for rash.  Allergic/Immunologic: Positive for environmental allergies. Negative for immunocompromised state.       Mild--no meds  Neurological:  Negative for dizziness,  syncope, light-headedness and headaches.  Hematological:  Negative for adenopathy. Does not bruise/bleed easily.  Psychiatric/Behavioral:  Negative for dysphoric mood and sleep disturbance. The patient is not nervous/anxious.        Objective:   Physical Exam Constitutional:      Appearance: Normal appearance.  HENT:  Mouth/Throat:     Pharynx: No oropharyngeal exudate or posterior oropharyngeal erythema.  Eyes:     Conjunctiva/sclera: Conjunctivae normal.     Pupils: Pupils are equal, round, and reactive to light.  Cardiovascular:     Rate and Rhythm: Normal rate and regular rhythm.     Pulses: Normal pulses.     Heart sounds: No murmur heard.    No gallop.  Pulmonary:     Effort: Pulmonary effort is normal.     Breath sounds: Normal breath sounds. No wheezing or rales.  Abdominal:     Palpations: Abdomen is soft.     Tenderness: There is no abdominal tenderness.  Musculoskeletal:     Cervical back: Neck supple.     Right lower leg: No edema.     Left lower leg: No edema.  Lymphadenopathy:     Cervical: No cervical adenopathy.  Skin:    Findings: No rash.     Comments: Benign raised lesion right hairline (temple)  Neurological:     General: No focal deficit present.     Mental Status: She is alert and oriented to person, place, and time.  Psychiatric:        Mood and Affect: Mood normal.        Behavior: Behavior normal.            Assessment & Plan:

## 2024-04-16 NOTE — Assessment & Plan Note (Signed)
 She is concerned due to past problems Will set up GI evaluation

## 2024-04-16 NOTE — Assessment & Plan Note (Signed)
 Will change to synthroid 

## 2024-04-21 ENCOUNTER — Encounter: Payer: Medicare HMO | Admitting: Internal Medicine

## 2024-07-26 ENCOUNTER — Encounter: Payer: Self-pay | Admitting: Internal Medicine

## 2024-09-30 ENCOUNTER — Ambulatory Visit

## 2024-09-30 VITALS — BP 124/74 | HR 77 | Temp 98.7°F | Ht 63.5 in | Wt 170.2 lb

## 2024-09-30 DIAGNOSIS — Z0000 Encounter for general adult medical examination without abnormal findings: Secondary | ICD-10-CM

## 2024-09-30 NOTE — Patient Instructions (Signed)
 Victoria Randall,  Thank you for taking the time for your Medicare Wellness Visit. I appreciate your continued commitment to your health goals. Please review the care plan we discussed, and feel free to reach out if I can assist you further.  Please note that Annual Wellness Visits do not include a physical exam. Some assessments may be limited, especially if the visit was conducted virtually. If needed, we may recommend an in-person follow-up with your provider.  Ongoing Care Seeing your primary care provider every 3 to 6 months helps us  monitor your health and provide consistent, personalized care.   Referrals If a referral was made during today's visit and you haven't received any updates within two weeks, please contact the referred provider directly to check on the status.  Recommended Screenings:  Health Maintenance  Topic Date Due   Colon Cancer Screening  08/27/2022   Medicare Annual Wellness Visit  04/15/2024   COVID-19 Vaccine (5 - 2025-26 season) 04/26/2024   Flu Shot  11/23/2024*   Breast Cancer Screening  10/27/2025   DTaP/Tdap/Td vaccine (2 - Td or Tdap) 07/09/2028   Pneumococcal Vaccine for age over 48  Completed   Osteoporosis screening with Bone Density Scan  Completed   Zoster (Shingles) Vaccine  Completed   Meningitis B Vaccine  Aged Out   Hepatitis C Screening  Discontinued  *Topic was postponed. The date shown is not the original due date.       09/30/2024    1:13 PM  Advanced Directives  Does Patient Have a Medical Advance Directive? Yes  Type of Estate Agent of [place];Living will  Does patient want to make changes to medical advance directive? No - Patient declined  Copy of Healthcare Power of Attorney in Chart? No - copy requested    Vision: Annual vision screenings are recommended for early detection of glaucoma, cataracts, and diabetic retinopathy. These exams can also reveal signs of chronic conditions such as diabetes and high blood  pressure.  Dental: Annual dental screenings help detect early signs of oral cancer, gum disease, and other conditions linked to overall health, including heart disease and diabetes.  Please see the attached documents for additional preventive care recommendations.

## 2024-09-30 NOTE — Progress Notes (Signed)
 "  Chief Complaint  Patient presents with   Medicare Wellness     Subjective:   Victoria Randall is a 69 y.o. female who presents for a Medicare Annual Wellness Visit.  Visit info / Clinical Intake: Medicare Wellness Visit Type:: Initial Annual Wellness Visit Persons participating in visit and providing information:: patient Medicare Wellness Visit Mode:: In-person (required for WTM) Interpreter Needed?: No Pre-visit prep was completed: yes AWV questionnaire completed by patient prior to visit?: no Living arrangements:: (!) lives alone Patient's Overall Health Status Rating: very good Typical amount of pain: none Does pain affect daily life?: no Are you currently prescribed opioids?: no  Dietary Habits and Nutritional Risks How many meals a Fort[place]?: 3 Eats fruit and vegetables daily?: yes Most meals are obtained by: preparing own meals In the last 2 weeks, have you had any of the following?: none Diabetic:: no  Functional Status Activities of Daily Living (to include ambulation/medication): Independent Ambulation: Independent Medication Administration: Independent Home Management (perform basic housework or laundry): Independent Manage your own finances?: yes Primary transportation is: driving Concerns about vision?: no *vision screening is required for WTM* Concerns about hearing?: no (asks people to repeat sometimes)  Fall Screening Falls in the past year?: 0 Number of falls in past year: 0 Was there an injury with Fall?: 0 Fall Risk Category Calculator: 0 Patient Fall Risk Level: Low Fall Risk  Fall Risk Patient at Risk for Falls Due to: No Fall Risks Fall risk Follow up: Falls prevention discussed; Education provided; Falls evaluation completed  Home and Transportation Safety: All rugs have non-skid backing?: yes All stairs or steps have railings?: yes Grab bars in the bathtub or shower?: (!) no Have non-skid surface in bathtub or shower?: (!) no Good home  lighting?: yes Regular seat belt use?: yes Hospital stays in the last year:: no  Cognitive Assessment Difficulty concentrating, remembering, or making decisions? : no Will 6CIT or Mini Cog be Completed: yes What year is it?: 0 points What month is it?: 0 points Give patient an address phrase to remember (5 components): [address] [place] About what time is it?: 0 points Count backwards from 20 to 1: 0 points Say the months of the year in reverse: 0 points Repeat the address phrase from earlier: 0 points 6 CIT Score: 0 points  Advance Directives (For Healthcare) Does Patient Have a Medical Advance Directive?: Yes Does patient want to make changes to medical advance directive?: No - Patient declined Type of Advance Directive: Healthcare Power of [place]; Living will Copy of Healthcare Power of Attorney in Chart?: No - copy requested Copy of Living Will in Chart?: No - copy requested  Reviewed/Updated  Reviewed/Updated: Reviewed All (Medical, Surgical, Family, Medications, Allergies, Care Teams, Patient Goals)    Allergies (verified) Patient has no known allergies.   Current Medications (verified) Outpatient Encounter Medications as of 09/30/2024  Medication Sig   Cholecalciferol (VITAMIN D3 GUMMIES ADULT PO) Take by mouth.   Multiple Vitamin (MULTIVITAMIN ADULT PO) Take by mouth.   SYNTHROID  88 MCG tablet Take 1 tablet (88 mcg total) by mouth daily before breakfast.   No facility-administered encounter medications on file as of 09/30/2024.    History: Past Medical History:  Diagnosis Date   Arthritis    knees   Chondromalacia of knee, left    Gallstones    GERD (gastroesophageal reflux disease)    hx of   Hypothyroidism    Past Surgical History:  Procedure Laterality Date  CHOLECYSTECTOMY     COLONOSCOPY WITH PROPOFOL  N/A 08/27/2017   Procedure: COLONOSCOPY WITH PROPOFOL ;  Surgeon: Teresa Lonni HERO, MD;  Location: [facility];  Service: General;   Laterality: N/A;   HERNIA REPAIR     left knee meniscus     ORTHOPEDIC SURGERY Right    knee   SPIGELIAN HERNIA  08/2017   UMBILICAL HERNIA REPAIR N/A 08/29/2017   Procedure: DIAGNOSTIC LAPAROSCOPY PRIMARY REPAIR OF LEFT SPIGELIAN HERNIA  AND PRIMARY REPAIR OF UMBILICAL HERNIA;  Surgeon: Eletha Boas, MD;  Location: [facility];  Service: General;  Laterality: N/A;   UMBILICAL HERNIA REPAIR     Family History  Problem Relation Age of Onset   Other Mother        squamous cell tumor, metabolic acidosis   Squamous cell carcinoma Mother    Lung cancer Father    Cancer - Lung Father    Heart disease Brother        bypass surgery in his early 50s   Stroke Paternal Grandfather    Heart attack Maternal Aunt        massive, in her 46s   Breast cancer Neg Hx    Colon cancer Neg Hx    Social History   Occupational History   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  Tobacco Use   Smoking status: Former    Current packs/[place]: 0.00    Average packs/Meas: 0.3 packs/[place] for 10.0 years (2.5 ttl pk-yrs)    Types: Cigarettes    Start date: 08/23/1975    Quit date: 08/22/1985    Years since quitting: 39.1    Passive exposure: Past   Smokeless tobacco: Never  Vaping Use   Vaping status: Never Used  Substance and Sexual Activity   Alcohol use: Yes    Comment: social   Drug use: No   Sexual activity: Yes    Birth control/protection: Post-menopausal   Tobacco Counseling Counseling given: Not Answered  SDOH Screenings   Food Insecurity: No Food Insecurity (09/30/2024)  Housing: Low Risk (09/30/2024)  Transportation Needs: No Transportation Needs (09/30/2024)  Utilities: Not At Risk (09/30/2024)  Alcohol Screen: Low Risk (09/30/2024)  Depression (PHQ2-9): Low Risk (09/30/2024)  Financial Resource Strain: Low Risk (09/30/2024)  Physical Activity: Sufficiently Active (09/30/2024)  Social Connections: Moderately Isolated (09/30/2024)  Stress: No Stress Concern  Present (09/30/2024)  Tobacco Use: Medium Risk (09/30/2024)  Health Literacy: Adequate Health Literacy (09/30/2024)   See flowsheets for full screening details  Depression Screen PHQ 2 & 9 Depression Scale- Over the past 2 weeks, how often have you Randall bothered by any of the following problems? Little interest or pleasure in doing things: 0 Feeling down, depressed, or hopeless (PHQ Adolescent also includes...irritable): 0 PHQ-2 Total Score: 0 Trouble falling or staying asleep, or sleeping too much: 0 Feeling tired or having little energy: 0 Poor appetite or overeating (PHQ Adolescent also includes...weight loss): 0 Feeling bad about yourself - or that you are a failure or have let yourself or your family down: 0 Trouble concentrating on things, such as reading the newspaper or watching television (PHQ Adolescent also includes...like school work): 0 Moving or speaking so slowly that other people could have noticed. Or the opposite - being so fidgety or restless that you have Randall moving around a lot more than usual: 0 Thoughts that you would be better off dead, or of hurting yourself in some way: 0 PHQ-9 Total Score: 0 If you checked  off any problems, how difficult have these problems made it for you to do your work, take care of things at home, or get along with other people?: Not difficult at all  Depression Treatment Depression Interventions/Treatment : [id number] Score <4 Follow-up Not Indicated     Goals Addressed               This Visit's Progress     wants weigh 140-150 pounds (pt-stated)               Objective:    Today's Vitals   09/30/24 1307  BP: 124/74  Pulse: 77  Temp: 98.7 F (37.1 C)  TempSrc: Oral  SpO2: 95%  Weight: 170 lb 3.2 oz (77.2 kg)  Height: 5' 3.5 (1.613 m)   Body mass index is 29.68 kg/m.  Hearing/Vision screen Vision Screening - Comments:: No regular eye exams Immunizations and Health Maintenance Health Maintenance  Topic Date Due    Colonoscopy  08/27/2022   COVID-19 Vaccine (5 - 2025-26 season) 04/26/2024   Influenza Vaccine  11/23/2024 (Originally 03/26/2024)   Medicare Annual Wellness (AWV)  09/30/2025   Mammogram  10/27/2025   DTaP/Tdap/Td (2 - Td or Tdap) 07/09/2028   Pneumococcal Vaccine: 50+ Years  Completed   Bone Density Scan  Completed   Zoster Vaccines- Shingrix  Completed   Meningococcal B Vaccine  Aged Out   Hepatitis C Screening  Discontinued        Assessment/Plan:  This is a routine wellness examination for [place].  Patient Care Team: Jimmy Charlie FERNS, MD as PCP - General (Internal Medicine)  I have personally reviewed and noted the following in the patients chart:   Medical and social history Use of alcohol, tobacco or illicit drugs  Current medications and supplements including opioid prescriptions. Functional ability and status Nutritional status Physical activity Advanced directives List of other physicians Hospitalizations, surgeries, and ER visits in previous 12 months Vitals Screenings to include cognitive, depression, and falls Referrals and appointments  No orders of the defined types were placed in this encounter.  In addition, I have reviewed and discussed with patient certain preventive protocols, quality metrics, and best practice recommendations. A written personalized care plan for preventive services as well as general preventive health recommendations were provided to patient.   Ardella FORBES Dawn, LPN   02/27/7972   Return in 1 year (on 09/30/2025).  After Visit Summary: (In Person-Declined) Patient declined AVS at this time.  Nurse Notes: Screenings not ordered: Declined Referral/Order for colonoscopy. States she has already received a letter to call to schedule the colonoscopy HM Addressed: Vaccines Due: covid  "

## 2025-04-20 ENCOUNTER — Encounter

## 2025-04-27 ENCOUNTER — Encounter

## 2025-10-06 ENCOUNTER — Ambulatory Visit
